# Patient Record
Sex: Male | Born: 1965 | Race: White | Hispanic: No | Marital: Married | Smoking: Never smoker
Health system: Southern US, Community
[De-identification: ages and names within clinical notes are randomized; demographics above are authoritative.]

## PROBLEM LIST (undated history)

## (undated) DIAGNOSIS — I10 Essential (primary) hypertension: Secondary | ICD-10-CM

## (undated) DIAGNOSIS — D649 Anemia, unspecified: Secondary | ICD-10-CM

## (undated) DIAGNOSIS — K900 Celiac disease: Secondary | ICD-10-CM

## (undated) DIAGNOSIS — Z8489 Family history of other specified conditions: Secondary | ICD-10-CM

## (undated) DIAGNOSIS — R7303 Prediabetes: Secondary | ICD-10-CM

## (undated) DIAGNOSIS — G4730 Sleep apnea, unspecified: Secondary | ICD-10-CM

## (undated) DIAGNOSIS — J189 Pneumonia, unspecified organism: Secondary | ICD-10-CM

## (undated) HISTORY — DX: Celiac disease: K90.0

## (undated) HISTORY — DX: Sleep apnea, unspecified: G47.30

## (undated) HISTORY — PX: TONSILLECTOMY: SHX5217

---

## 2009-07-09 ENCOUNTER — Inpatient Hospital Stay (HOSPITAL_COMMUNITY): Admission: EM | Admit: 2009-07-09 | Discharge: 2009-07-11 | Payer: Self-pay | Admitting: Emergency Medicine

## 2009-07-09 ENCOUNTER — Ambulatory Visit: Payer: Self-pay | Admitting: Family Medicine

## 2009-07-09 ENCOUNTER — Telehealth: Payer: Self-pay | Admitting: Family Medicine

## 2009-07-09 DIAGNOSIS — R0609 Other forms of dyspnea: Secondary | ICD-10-CM | POA: Insufficient documentation

## 2009-07-09 DIAGNOSIS — R5383 Other fatigue: Secondary | ICD-10-CM

## 2009-07-09 DIAGNOSIS — J309 Allergic rhinitis, unspecified: Secondary | ICD-10-CM | POA: Insufficient documentation

## 2009-07-09 DIAGNOSIS — J45909 Unspecified asthma, uncomplicated: Secondary | ICD-10-CM | POA: Insufficient documentation

## 2009-07-09 DIAGNOSIS — R5381 Other malaise: Secondary | ICD-10-CM | POA: Insufficient documentation

## 2009-07-09 DIAGNOSIS — R0989 Other specified symptoms and signs involving the circulatory and respiratory systems: Secondary | ICD-10-CM

## 2009-07-09 LAB — CONVERTED CEMR LAB
Albumin: 4 g/dL (ref 3.5–5.2)
HCT: 14.2 % — CL (ref 39.0–52.0)
HDL: 28 mg/dL — ABNORMAL LOW (ref >39.00)
Hemoglobin: 4 g/dL — CL (ref 13.0–17.0)
Iron: 6 ug/dL — ABNORMAL LOW (ref 42–165)
LDL Cholesterol: 86 mg/dL (ref 0–99)
RBC: 2.38 M/uL — ABNORMAL LOW (ref 4.22–5.81)
Saturation Ratios: 1 % — ABNORMAL LOW (ref 20.0–50.0)
Total CHOL/HDL Ratio: 5
Transferrin: 444.2 mg/dL — ABNORMAL HIGH (ref 212.0–360.0)
Triglycerides: 84 mg/dL (ref 0.0–149.0)
WBC: 9.3 10*3/uL (ref 4.5–10.5)

## 2009-07-11 ENCOUNTER — Encounter (INDEPENDENT_AMBULATORY_CARE_PROVIDER_SITE_OTHER): Payer: Self-pay | Admitting: Gastroenterology

## 2009-07-21 ENCOUNTER — Ambulatory Visit: Payer: Self-pay | Admitting: Family Medicine

## 2009-07-21 ENCOUNTER — Encounter: Payer: Self-pay | Admitting: Family Medicine

## 2009-07-21 DIAGNOSIS — D509 Iron deficiency anemia, unspecified: Secondary | ICD-10-CM | POA: Insufficient documentation

## 2009-07-21 LAB — CONVERTED CEMR LAB
Basophils Absolute: 0 10*3/uL (ref 0.0–0.1)
HCT: 32.7 % — ABNORMAL LOW (ref 39.0–52.0)
Lymphs Abs: 1 10*3/uL (ref 0.7–4.0)
Monocytes Relative: 8.2 % (ref 3.0–12.0)
Platelets: 451 10*3/uL — ABNORMAL HIGH (ref 150.0–400.0)
RDW: 28.6 % — ABNORMAL HIGH (ref 11.5–14.6)
Retic Ct Pct: 1 % (ref 0.4–3.1)

## 2009-08-05 ENCOUNTER — Telehealth: Payer: Self-pay | Admitting: Family Medicine

## 2009-09-10 ENCOUNTER — Encounter: Payer: Self-pay | Admitting: Family Medicine

## 2009-09-10 ENCOUNTER — Ambulatory Visit: Payer: Self-pay | Admitting: Family Medicine

## 2009-09-10 DIAGNOSIS — K900 Celiac disease: Secondary | ICD-10-CM | POA: Insufficient documentation

## 2009-09-14 LAB — CONVERTED CEMR LAB
Basophils Absolute: 0.1 10*3/uL (ref 0.0–0.1)
Eosinophils Absolute: 0.3 10*3/uL (ref 0.0–0.7)
HCT: 39.2 % (ref 39.0–52.0)
Hemoglobin: 12.9 g/dL — ABNORMAL LOW (ref 13.0–17.0)
Lymphocytes Relative: 17.2 % (ref 12.0–46.0)
Lymphs Abs: 0.9 10*3/uL (ref 0.7–4.0)
MCHC: 33 g/dL (ref 30.0–36.0)
Monocytes Absolute: 0.5 10*3/uL (ref 0.1–1.0)
Neutro Abs: 3.6 10*3/uL (ref 1.4–7.7)
RDW: 13 % (ref 11.5–14.6)

## 2009-09-28 ENCOUNTER — Encounter: Admission: RE | Admit: 2009-09-28 | Discharge: 2009-09-28 | Payer: Self-pay | Admitting: Gastroenterology

## 2009-12-14 ENCOUNTER — Ambulatory Visit: Payer: Self-pay | Admitting: Family Medicine

## 2009-12-14 LAB — CONVERTED CEMR LAB: Iron: 85 ug/dL (ref 42–165)

## 2009-12-15 LAB — CONVERTED CEMR LAB
Basophils Absolute: 0 10*3/uL (ref 0.0–0.1)
HCT: 44.4 % (ref 39.0–52.0)
Lymphs Abs: 1.2 10*3/uL (ref 0.7–4.0)
MCHC: 33.9 g/dL (ref 30.0–36.0)
MCV: 89.1 fL (ref 78.0–100.0)
Monocytes Absolute: 0.7 10*3/uL (ref 0.1–1.0)
Platelets: 272 10*3/uL (ref 150.0–400.0)
RDW: 12.8 % (ref 11.5–14.6)

## 2010-01-17 ENCOUNTER — Ambulatory Visit: Payer: Self-pay | Admitting: Family Medicine

## 2010-01-17 DIAGNOSIS — H1030 Unspecified acute conjunctivitis, unspecified eye: Secondary | ICD-10-CM | POA: Insufficient documentation

## 2010-01-17 DIAGNOSIS — J0110 Acute frontal sinusitis, unspecified: Secondary | ICD-10-CM | POA: Insufficient documentation

## 2010-03-29 ENCOUNTER — Telehealth: Payer: Self-pay | Admitting: Family Medicine

## 2010-03-31 ENCOUNTER — Ambulatory Visit: Payer: Self-pay | Admitting: Family Medicine

## 2010-04-01 LAB — CONVERTED CEMR LAB
Basophils Relative: 0.3 % (ref 0.0–3.0)
Eosinophils Absolute: 0.2 10*3/uL (ref 0.0–0.7)
HCT: 45.9 % (ref 39.0–52.0)
Lymphs Abs: 1.3 10*3/uL (ref 0.7–4.0)
MCHC: 33.6 g/dL (ref 30.0–36.0)
MCV: 91.7 fL (ref 78.0–100.0)
Monocytes Absolute: 0.6 10*3/uL (ref 0.1–1.0)
Neutrophils Relative %: 73.7 % (ref 43.0–77.0)
Platelets: 300 10*3/uL (ref 150.0–400.0)

## 2010-06-19 IMAGING — CR DG CHEST 2V
3 series · 3 of 3 positions shown · non-contrast
Comparison: None

CLINICAL DATA: Shortness of breath

CHEST - 2 VIEW

[w chest pa]
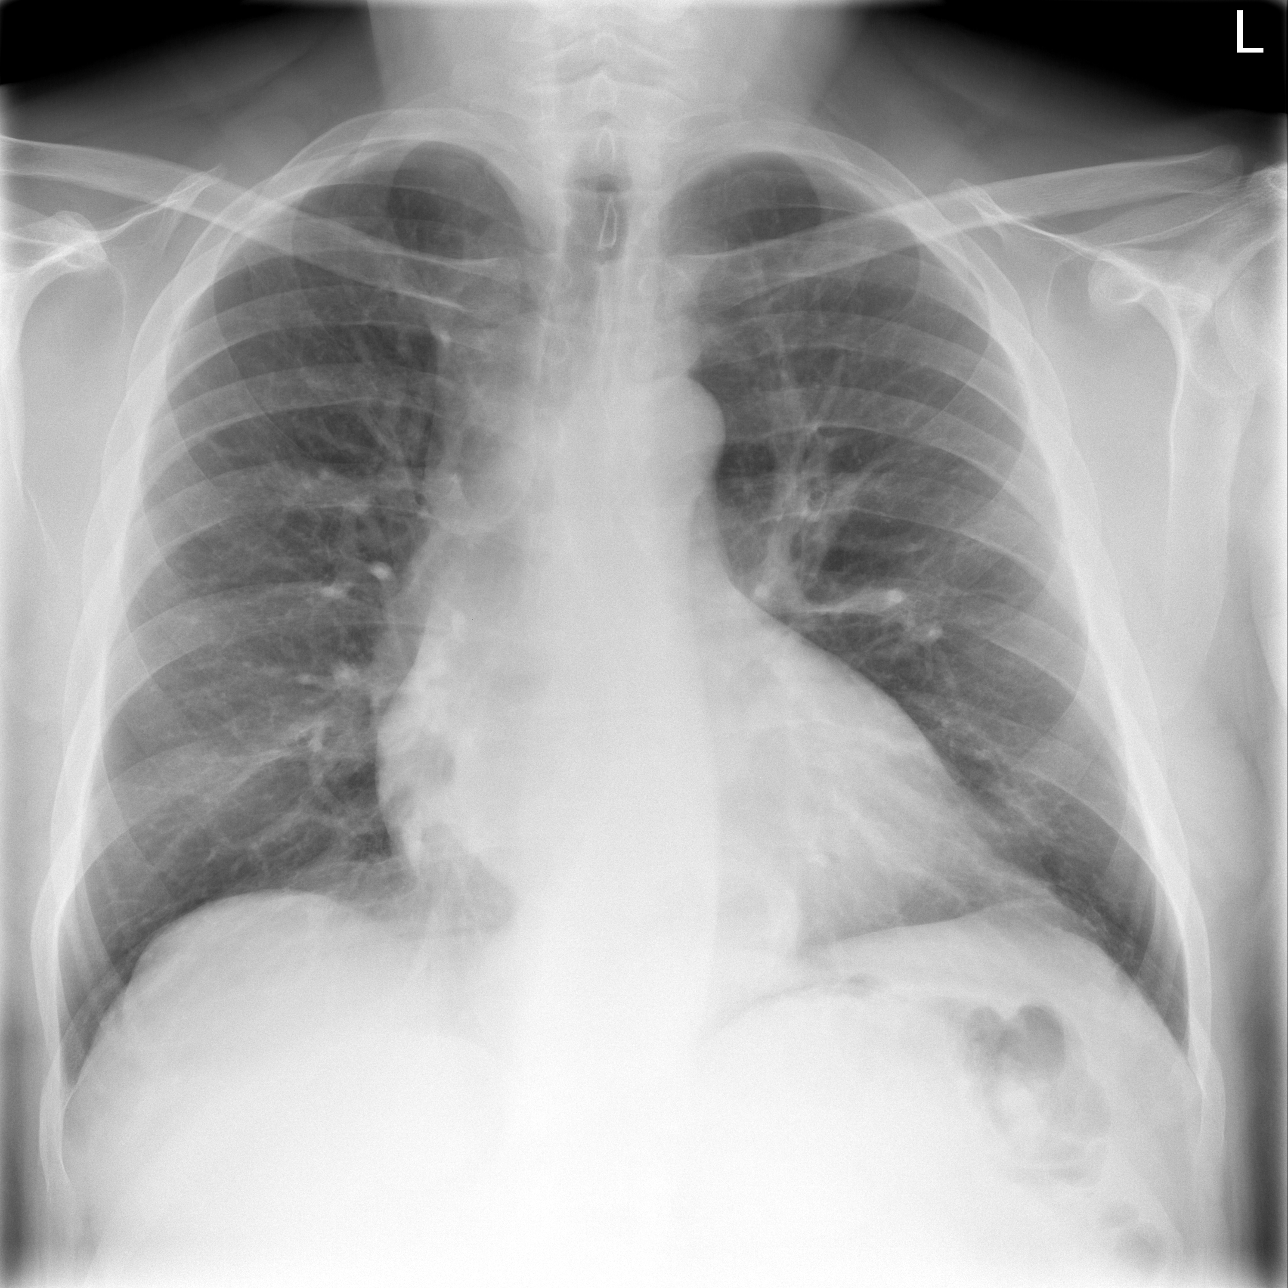

[w chest lat (1 of 2)]
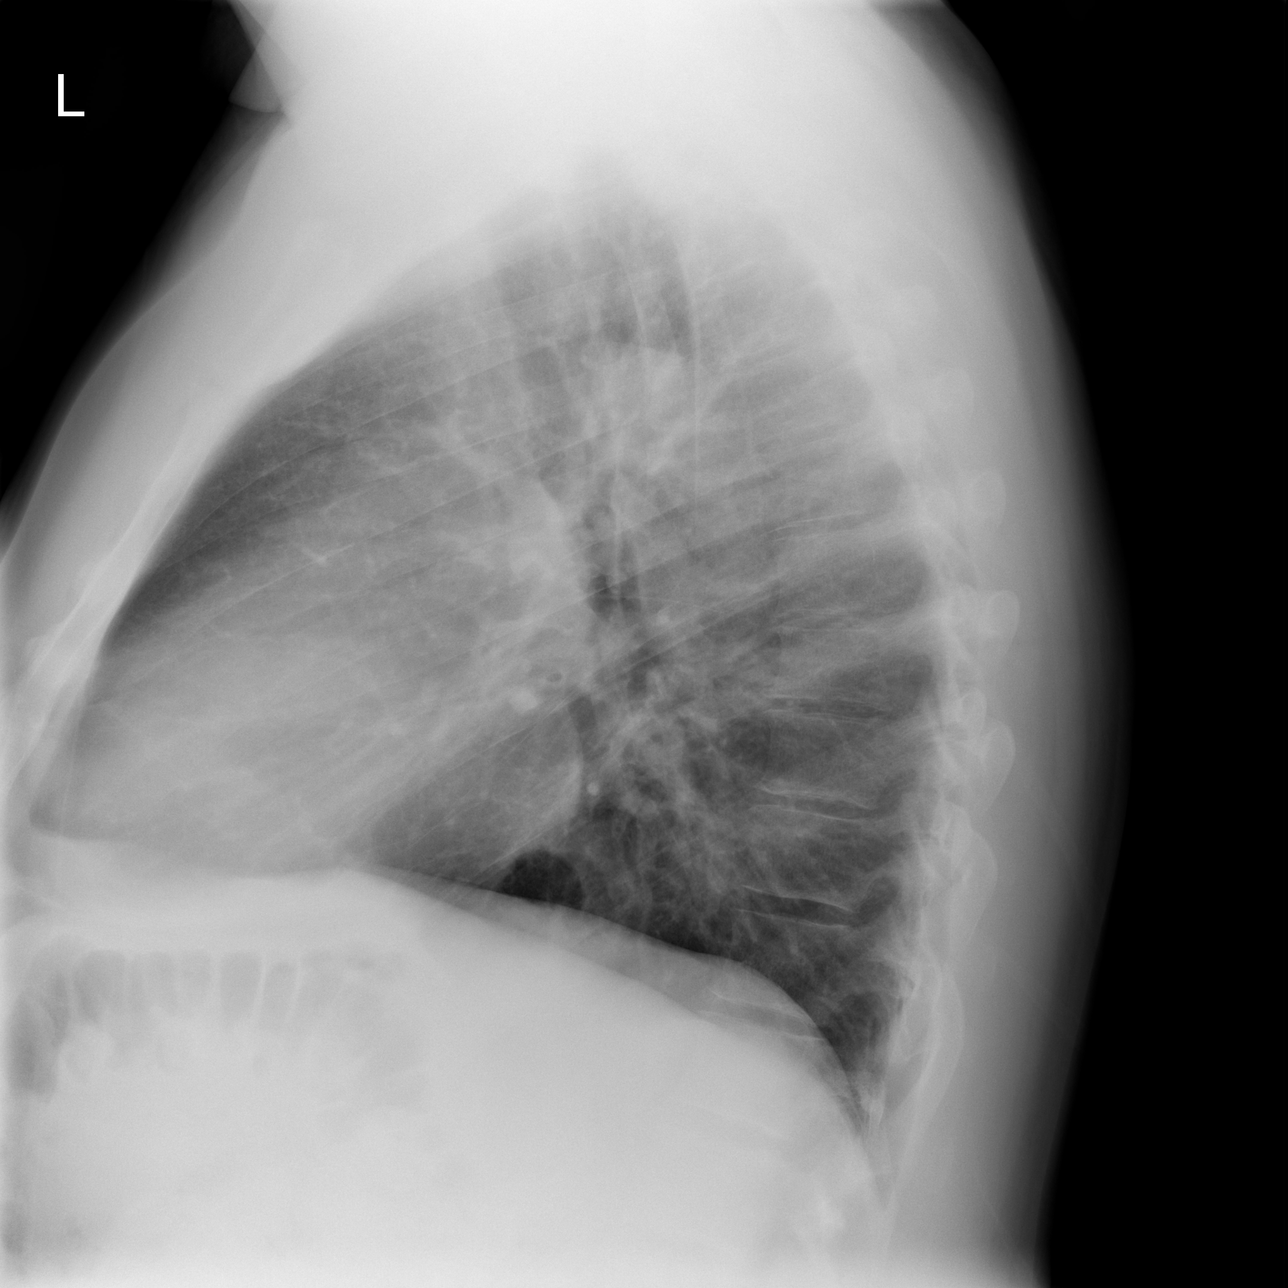

[w chest lat (2 of 2)]
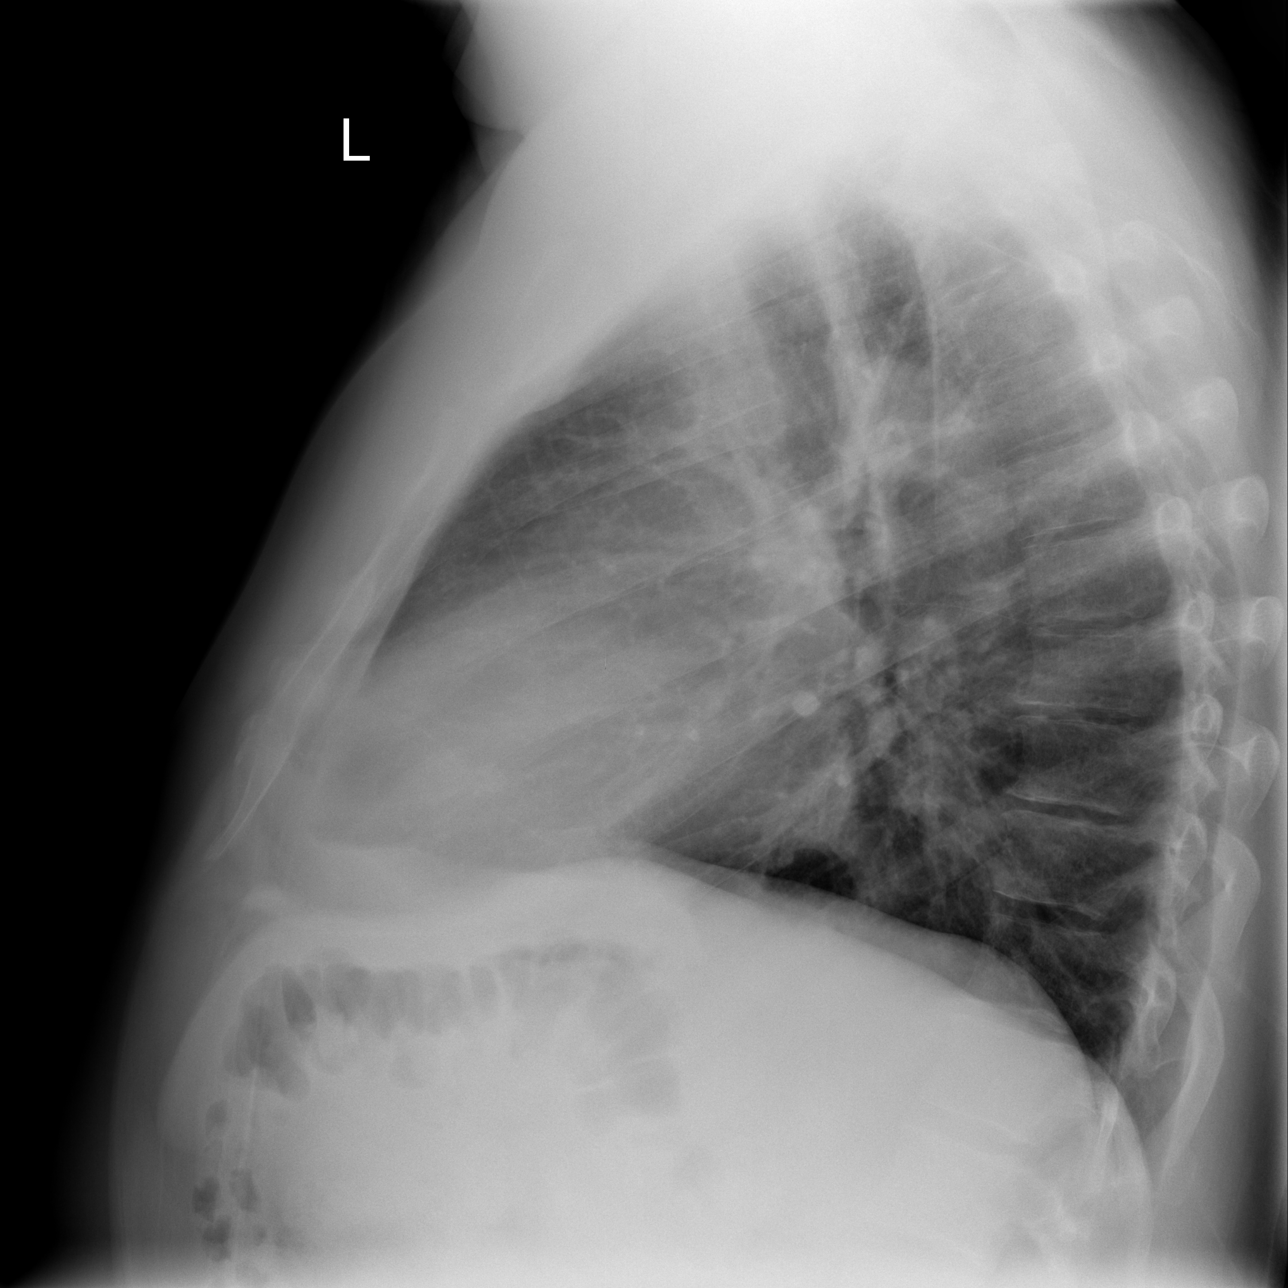

[3 of 3 positions shown; findings below may reference images not displayed]

FINDINGS: Heart size is mildly prominent, with suggestion of left
atrial hypertrophy.  Pulmonary vascularity is normal.  Minimal
linear left basilar atelectasis.  No airspace disease, effusion, or
pneumothorax.  Small to moderate hiatal hernia is noted.  No acute
osseous abnormality.
IMPRESSION: 1. Mild cardiomegaly with probable left atrial enlargement.
2.  Minimal left basilar atelectasis.
3.  Small to moderate hiatal hernia.

## 2010-09-27 NOTE — Progress Notes (Signed)
Summary: regarding hgb  Phone Note Call from Patient Call back at Home Phone [phone]   Caller: Patient Call For: Arnette Norris MD Summary of Call: Pt asks if he needs to continue to get his hgb checked, since he had to have a transfusion last fall,  last check in april was 15.1. Initial call taken by: Marty Heck CMA,  March 29, 2010 11:58 AM  Follow-up for Phone Call        WE can recheck it again at his convenience and if it is ok then, we can stop routinely checking it. Arnette Norris MD  March 29, 2010 12:08 PM  Advised pt, lab appt made.            Marty Heck CMA  March 29, 2010 2:42 PM

## 2010-09-27 NOTE — Assessment & Plan Note (Signed)
Summary: pink eye/alc   Vital Signs:  Patient profile:   45 year old male Height:      74 inches Weight:      298.50 pounds BMI:     38.46 Temp:     98.6 degrees F oral Pulse rate:   76 / minute Pulse rhythm:   regular BP sitting:   140 / 120  (left arm) Cuff size:   large  Vitals Entered By: Sherrian Divers CMA Deborra Medina) (Jan 17, 2010 12:09 PM) CC: ? Pink eye   History of Present Illness: 45 yo here for chest cold and ?pink eye.  Over one week ago, started with runny nose, productive cough. Felt like he was getting better until two days ago, head congestion worsened, now has facial pressure. Woke up yesterday, both eyes matted shut with thich green discharge. Today matted shut again and eyes are very irritated and red. No photophobia, blurred vision. Subjective fevers, no chills. No n/v/d.  Current Medications (verified): 1)  Zyrtec Allergy 10 Mg Tabs (Cetirizine Hcl) .... Take 1 Tablet By Mouth Once A Day As Needed 2)  Ferrous Sulfate 325 (65 Fe) Mg Tabs (Ferrous Sulfate) .... Take 1 Tablet By Mouth Two Times A Day 3)  Ventolin Hfa 108 (90 Base) Mcg/act Aers (Albuterol Sulfate) .Marland Kitchen.. 1-2 Puff Every 4 Hours As Needed. 4)  Nexium 40 Mg Cpdr (Esomeprazole Magnesium) .... Take One Tablet By Mouth Daily 5)  Polytrim 10000-0.1 Unit/ml-%  Soln (Polymyxin B-Trimethoprim) .Marland Kitchen.. 1 Gtt in Affected Eye Gid X 5days 6)  Azithromycin 250 Mg  Tabs (Azithromycin) .... 2 By  Mouth Today and Then 1 Daily For 4 Days  Allergies (verified): No Known Drug Allergies  Review of Systems      See HPI General:  Complains of fever; denies chills. Eyes:  Complains of discharge and eye irritation; denies blurring, double vision, eye pain, halos, itching, light sensitivity, vision loss-1 eye, and vision loss-both eyes. ENT:  Complains of nasal congestion, sinus pressure, and sore throat. CV:  Denies chest pain or discomfort. Resp:  Complains of cough and sputum productive; denies shortness of breath and  wheezing.  Physical Exam  General:  alert, obese, congested Eyes:  pupils equal, pupils round, and conjunctival injection bilaterally, thick yellow discharge crusted on eyelashes.   Ears:  TMs retracted bilaterally. Nose:  mucosal erythema and mucosal edema.   frontal sinuses TTP Mouth:  Oral mucosa and oropharynx without lesions or exudates.  Teeth in good repair. Lungs:  Normal respiratory effort, chest expands symmetrically. Lungs are clear to auscultation, no crackles or wheezes. Heart:  Normal rate and regular rhythm. S1 and S2 normal without gallop, murmur, click, rub or other extra sounds. Extremities:  No clubbing, cyanosis, edema, or deformity noted with normal full range of motion of all joints.   Psych:  normally interactive, good eye contact, not anxious appearing, and not depressed appearing.     Impression & Recommendations:  Problem # 1:  CONJUNCTIVITIS, BACTERIAL, ACUTE (ICD-372.00) Assessment New Polytrim drops, advised good hand hygeine. His updated medication list for this problem includes:    Polytrim 10000-0.1 Unit/ml-% Soln (Polymyxin b-trimethoprim) .Marland Kitchen... 1 gtt in affected eye gid x 5days  Problem # 2:  ACUTE FRONTAL SINUSITIS (ICD-461.1) Assessment: New Given duration and progression of symptoms, likely bacterial. See pt instructions for details. His updated medication list for this problem includes:    Azithromycin 250 Mg Tabs (Azithromycin) .Marland Kitchen... 2 by  mouth today and then 1 daily for 4 days  Complete Medication List: 1)  Zyrtec Allergy 10 Mg Tabs (Cetirizine hcl) .... Take 1 tablet by mouth once a day as needed 2)  Ferrous Sulfate 325 (65 Fe) Mg Tabs (Ferrous sulfate) .... Take 1 tablet by mouth two times a day 3)  Ventolin Hfa 108 (90 Base) Mcg/act Aers (Albuterol sulfate) .Marland Kitchen.. 1-2 puff every 4 hours as needed. 4)  Nexium 40 Mg Cpdr (Esomeprazole magnesium) .... Take one tablet by mouth daily 5)  Polytrim 10000-0.1 Unit/ml-% Soln (Polymyxin  b-trimethoprim) .Marland Kitchen.. 1 gtt in affected eye gid x 5days 6)  Azithromycin 250 Mg Tabs (Azithromycin) .... 2 by  mouth today and then 1 daily for 4 days  Patient Instructions: 1)  Take Zpack as directed if no improvement in next few days.  Drink lots of fluids.  Treat sympotmatically with Mucinex, nasal saline irrigation, and Tylenol/Ibuprofen. . You can use warm compresses.   Call if not improving as expected in 5-7 days.  Prescriptions: AZITHROMYCIN 250 MG  TABS (AZITHROMYCIN) 2 by  mouth today and then 1 daily for 4 days  #6 x 0   Entered and Authorized by:   Arnette Norris MD   Signed by:   Arnette Norris MD on 01/17/2010   Method used:   Electronically to        [facility] (retail)       [place], [place]  [address]       Ph: [phone]       Fax: [phone]   RxID:   [id number] POLYTRIM 10000-0.1 UNIT/ML-%  SOLN (POLYMYXIN B-TRIMETHOPRIM) 1 gtt in affected eye gid x 5days  #1 x 0   Entered and Authorized by:   Arnette Norris MD   Signed by:   Arnette Norris MD on 01/17/2010   Method used:   Electronically to        [facility] (retail)       [place], [place]  [address]       Ph: [phone]       Fax: [phone]   RxID:   [id number]   Current Allergies (reviewed today): No known allergies

## 2010-09-27 NOTE — Assessment & Plan Note (Signed)
Summary: 1 month follow up [place]   Vital Signs:  Patient profile:   45 year old male Height:      74 inches Weight:      265.13 pounds BMI:     34.16 Temp:     98.4 degrees F oral Pulse rate:   80 / minute Pulse rhythm:   regular BP sitting:   130 / 86  (left arm) Cuff size:   large  Vitals Entered By: Christena Deem CMA (AAMA) (September 10, 2009 8:16 AM) CC: 1 month follow up   History of Present Illness: 44 yo here for follow up with gluten defiiency/ severe iron deficiency anemia.  Admitted to [facility] on 11/12 with Hbg 4, TIBC 448, Ferritin 1, Retic 0.8, Iron 11, Vit B12 280. Discharged on 11/14.   No signs of active bleeding.  EGD results consistent with Celiac.  Gliadin IgG 18. Transfused 6 units in hospital, Hbg at DC was 8.5.  has been taking Ferrous Sulfate 325 mg two times a day and Protonix 40 mg daily.  He feels like a new man.  no longer fatigued or short of breath.  Started raking his leaves again. Continues taking his iron, no issues with constipation.  He would like to continue taking his protonix because it is helping greatly with his GERD symptoms.  No f/u scheduled with Dr. Hoyt Koch) but he does have appt with nutritionist on February 1st.  Current Medications (verified): 1)  Zyrtec Allergy 10 Mg Tabs (Cetirizine Hcl) .... Take 1 Tablet By Mouth Once A Day As Needed 2)  Ferrous Sulfate 325 (65 Fe) Mg Tabs (Ferrous Sulfate) .... Take 1 Tablet By Mouth Two Times A Day 3)  Pantoprazole Sodium 40 Mg Tbec (Pantoprazole Sodium) .... Take 1 Tablet By Mouth Once A Day 4)  Ventolin Hfa 108 (90 Base) Mcg/act Aers (Albuterol Sulfate) .Marland Kitchen.. 1-2 Puff Every 4 Hours As Needed.  Allergies (verified): No Known Drug Allergies  Review of Systems      See HPI General:  Denies fatigue and malaise. CV:  Denies chest pain or discomfort. Resp:  Denies shortness of breath.  Physical Exam  General:  alert, obese, NORMAL skin tone Mouth:  Oral mucosa and oropharynx without lesions or  exudates.  Teeth in good repair. Lungs:  Normal respiratory effort, chest expands symmetrically. Lungs are clear to auscultation, no crackles or wheezes. Heart:  Normal rate and regular rhythm. S1 and S2 normal without gallop, murmur, click, rub or other extra sounds. Abdomen:  Bowel sounds positive,abdomen soft and non-tender without masses, organomegaly or hernias noted. Psych:  normally interactive, good eye contact, not anxious appearing, and not depressed appearing.     Impression & Recommendations:  Problem # 1:  ANEMIA, IRON DEFICIENCY (ICD-280.9) Assessment Unchanged Due to gluten/celiac.  Will recheck iron studies today.  Follow up in 2-3 months. His updated medication list for this problem includes:    Ferrous Sulfate 325 (65 Fe) Mg Tabs (Ferrous sulfate) .Marland Kitchen... Take 1 tablet by mouth two times a day  Orders: Venipuncture (25956) TLB-CBC Platelet - w/Differential (85025-CBCD) TLB-IBC Pnl (Iron/FE;Transferrin) (83550-IBC) T-Reticulocyte Count, Automated (38756-43329)  Complete Medication List: 1)  Zyrtec Allergy 10 Mg Tabs (Cetirizine hcl) .... Take 1 tablet by mouth once a day as needed 2)  Ferrous Sulfate 325 (65 Fe) Mg Tabs (Ferrous sulfate) .... Take 1 tablet by mouth two times a day 3)  Pantoprazole Sodium 40 Mg Tbec (Pantoprazole sodium) .... Take 1 tablet by mouth once a day 4)  Ventolin Hfa 108 (90 Base) Mcg/act Aers (Albuterol sulfate) .Marland Kitchen.. 1-2 puff every 4 hours as needed.  Patient Instructions: 1)  Good to see you, Thayer Jew. 2)  Please come back to see me in 2-3 months. 3)  Have a great weekend. Prescriptions: VENTOLIN HFA 108 (90 BASE) MCG/ACT AERS (ALBUTEROL SULFATE) 1-2 puff every 4 hours as needed.  #1 x 0   Entered and Authorized by:   Arnette Norris MD   Signed by:   Arnette Norris MD on 09/10/2009   Method used:   Electronically to        [facility] (retail)       [place], [place]  [address]        Ph: [phone]       Fax: [phone]   RxID:   [id number]   Current Allergies (reviewed today): No known allergies   Appended Document: Orders Update    Clinical Lists Changes  Orders: Added new Service order of Specimen Handling (99000) - Signed

## 2010-10-17 ENCOUNTER — Ambulatory Visit (INDEPENDENT_AMBULATORY_CARE_PROVIDER_SITE_OTHER): Payer: BC Managed Care – PPO | Admitting: Family Medicine

## 2010-10-17 ENCOUNTER — Encounter: Payer: Self-pay | Admitting: Family Medicine

## 2010-10-17 ENCOUNTER — Other Ambulatory Visit: Payer: Self-pay | Admitting: Family Medicine

## 2010-10-17 ENCOUNTER — Ambulatory Visit: Payer: Self-pay | Admitting: Family Medicine

## 2010-10-17 DIAGNOSIS — K900 Celiac disease: Secondary | ICD-10-CM

## 2010-10-17 DIAGNOSIS — G4710 Hypersomnia, unspecified: Secondary | ICD-10-CM

## 2010-10-17 DIAGNOSIS — G4733 Obstructive sleep apnea (adult) (pediatric): Secondary | ICD-10-CM | POA: Insufficient documentation

## 2010-10-17 DIAGNOSIS — G4730 Sleep apnea, unspecified: Secondary | ICD-10-CM

## 2010-10-17 DIAGNOSIS — R5381 Other malaise: Secondary | ICD-10-CM

## 2010-10-17 LAB — CBC WITH DIFFERENTIAL/PLATELET
Basophils Absolute: 0 10*3/uL (ref 0.0–0.1)
Basophils Relative: 0.3 % (ref 0.0–3.0)
Eosinophils Absolute: 0.4 10*3/uL (ref 0.0–0.7)
HCT: 36.8 % — ABNORMAL LOW (ref 39.0–52.0)
Hemoglobin: 11.6 g/dL — ABNORMAL LOW (ref 13.0–17.0)
Lymphocytes Relative: 10.8 % — ABNORMAL LOW (ref 12.0–46.0)
Lymphs Abs: 0.9 10*3/uL (ref 0.7–4.0)
MCHC: 31.6 g/dL (ref 30.0–36.0)
MCV: 88.1 fl (ref 78.0–100.0)
Monocytes Absolute: 0.6 10*3/uL (ref 0.1–1.0)
Neutro Abs: 6.1 10*3/uL (ref 1.4–7.7)
RBC: 4.18 Mil/uL — ABNORMAL LOW (ref 4.22–5.81)
RDW: 14.1 % (ref 11.5–14.6)

## 2010-10-17 LAB — B12 AND FOLATE PANEL: Folate: 5.2 ng/mL — ABNORMAL LOW (ref >5.9)

## 2010-10-17 LAB — BASIC METABOLIC PANEL
CO2: 30 mEq/L (ref 19–32)
Calcium: 9.3 mg/dL (ref 8.4–10.5)
Chloride: 102 mEq/L (ref 96–112)
Glucose, Bld: 86 mg/dL (ref 70–99)
Sodium: 140 mEq/L (ref 135–145)

## 2010-10-18 ENCOUNTER — Ambulatory Visit: Payer: Self-pay | Admitting: Family Medicine

## 2010-10-25 NOTE — Assessment & Plan Note (Signed)
Summary: ANEMIA/ SLEEPY ALL THE TIME/CLE   Vital Signs:  Patient profile:   45 year old male Weight:      311 pounds BMI:     40.07 Temp:     98.5 degrees F oral Pulse rate:   72 / minute Pulse rhythm:   regular BP sitting:   142 / 98  (left arm) Cuff size:   large  Vitals Entered By: Edwin Dada CMA Deborra Medina) (October 17, 2010 8:28 AM) CC: fatigue   History of Present Illness: 45 yo with h/o gluten defiiency/ severe iron deficiency anemia with 6 months of worsening fatigue.  Admitted to [facility] on 06/2009 with Hbg 4, TIBC 448, Ferritin 1, Retic 0.8, Iron 11, Vit B12 280.  EGD results consistent with Celiac.  Gliadin IgG 18. Has been taking Ferrous sulfate 325 mg two times a day since.  Have been following yearly CBC, most recently  within normal lmiits in 03/2010.  For past six months, he has noticed increased fatigue. No SOB or DOE like he had when first diagnosed but can fall asleep very easily- while at desk, sometimes even when driving.  He does have a long commute and thought nothing of his sleepiness but he feels like it is getting progressively worse. Wife does hear him cough/choke at times in the middle of the night. He feels like he has been getting plenty of sleep.  Has always had night time awakenings for urination, that has not increased.  No CP, orthostatis, blood in stool. Admits to being very compliant with gluten free diet on 3 out of 7 days per week.  Has gained 13 pounds since last office visit.  Current Medications (verified): 1)  Zyrtec Allergy 10 Mg Tabs (Cetirizine Hcl) .... Take 1 Tablet By Mouth Once A Day As Needed 2)  Ferrous Sulfate 325 (65 Fe) Mg Tabs (Ferrous Sulfate) .... Take 1 Tablet By Mouth Two Times A Day 3)  Ventolin Hfa 108 (90 Base) Mcg/act Aers (Albuterol Sulfate) .Marland Kitchen.. 1-2 Puff Every 4 Hours As Needed. 4)  Nexium 40 Mg Cpdr (Esomeprazole Magnesium) .... Take One Tablet By Mouth Daily  Allergies (verified): No Known Drug  Allergies  Past History:  Past Medical History: Last updated: 07/09/2009 Allergic rhinitis Asthma  Past Surgical History: Last updated: 07/09/2009 Tonsillectomy  Family History: Last updated: 07/09/2009 Mom - HTN Dad- HTn, HLD Maternal Grandfather- leukemia Paternal grandfather- ?CVA in 7s  Social History: Last updated: 07/09/2009 LIves with wife and two daughters, 54 and 15 in [place].  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 Never Smoked Alcohol use-no Drug use-no Regular exercise-no  Risk Factors: Exercise: no (07/09/2009)  Risk Factors: Smoking Status: never (07/09/2009)  Review of Systems      See HPI General:  Complains of fatigue; denies fever and loss of appetite. CV:  Denies chest pain or discomfort. Resp:  Denies shortness of breath. GI:  Denies abdominal pain, bloody stools, and change in bowel habits.  Physical Exam  General:  alert, obese, does not appear pale Head:  normocephalic and atraumatic.   Eyes:  vision grossly intact, pupils equal, pupils round, and pupils reactive to light.   Ears:  R ear normal and L ear normal.   Mouth:  good dentition.   Lungs:  Normal respiratory effort, chest expands symmetrically. Lungs are clear to auscultation, no crackles or wheezes. Heart:  Normal rate and regular rhythm. S1 and S2 normal without gallop, murmur, click, rub or other extra sounds.  Extremities:  No clubbing, cyanosis, edema, or deformity noted with normal full range of motion of all joints.   Psych:  normally interactive, good eye contact, not anxious appearing, and not depressed appearing.     Impression & Recommendations:  Problem # 1:  FATIGUE (ICD-780.79) Assessment Deteriorated likely multifactorial. ? worsening anemia with possible malabsorption due to celiac- will check CBC, Vit D, B12/folate, TIBC, TSH and a1c. Also appears to be a component of sleep apnea, less likely narcolepsy.  Will refer for sleep  study.  Problem # 2:  HYPERSOMNIA, ASSOCIATED WITH SLEEP APNEA (ICD-780.53) Assessment: New see above. Orders: Sleep Study (Sleep Study)  Complete Medication List: 1)  Zyrtec Allergy 10 Mg Tabs (Cetirizine hcl) .... Take 1 tablet by mouth once a day as needed 2)  Ferrous Sulfate 325 (65 Fe) Mg Tabs (Ferrous sulfate) .... Take 1 tablet by mouth two times a day 3)  Ventolin Hfa 108 (90 Base) Mcg/act Aers (Albuterol sulfate) .Marland Kitchen.. 1-2 puff every 4 hours as needed. 4)  Nexium 40 Mg Cpdr (Esomeprazole magnesium) .... Take one tablet by mouth daily  Other Orders: Venipuncture (03704) TLB-B12 + Folate Pnl (88891_69450-T88/EKC) TLB-IBC Pnl (Iron/FE;Transferrin) (83550-IBC) TLB-BMP (Basic Metabolic Panel-BMET) (00349-ZPHXTAV) TLB-CBC Platelet - w/Differential (85025-CBCD) TLB-TSH (Thyroid Stimulating Hormone) (84443-TSH) Specimen Handling (99000) TLB-A1C / Hgb A1C (Glycohemoglobin) (83036-A1C) T-Vitamin D (25-Hydroxy) (69794-80165)  Patient Instructions: 1)  Great to see you. 2)  Please stop by to see Rosaria Ferries on your way out.   Orders Added: 1)  Sleep Study [Sleep Study] 2)  Venipuncture [53748] 3)  TLB-B12 + Folate Pnl [82746_82607-B12/FOL] 4)  TLB-IBC Pnl (Iron/FE;Transferrin) [83550-IBC] 5)  TLB-BMP (Basic Metabolic Panel-BMET) [27078-MLJQGBE] 6)  TLB-CBC Platelet - w/Differential [85025-CBCD] 7)  TLB-TSH (Thyroid Stimulating Hormone) [84443-TSH] 8)  Specimen Handling [99000] 9)  TLB-A1C / Hgb A1C (Glycohemoglobin) [83036-A1C] 10)  T-Vitamin D (25-Hydroxy) [01007-12197] 11)  Est. Patient Level IV [58832]     Orders Added: 1)  Sleep Study [Sleep Study] 2)  Venipuncture [54982] 3)  TLB-B12 + Folate Pnl [82746_82607-B12/FOL] 4)  TLB-IBC Pnl (Iron/FE;Transferrin) [83550-IBC] 5)  TLB-BMP (Basic Metabolic Panel-BMET) [64158-XENMMHW] 6)  TLB-CBC Platelet - w/Differential [85025-CBCD] 7)  TLB-TSH (Thyroid Stimulating Hormone) [84443-TSH] 8)  Specimen Handling [99000] 9)   TLB-A1C / Hgb A1C (Glycohemoglobin) [83036-A1C] 10)  T-Vitamin D (25-Hydroxy) [80881-10315] 11)  Est. Patient Level IV [94585]   Current Allergies (reviewed today): No known allergies

## 2010-10-28 ENCOUNTER — Encounter: Payer: Self-pay | Admitting: Pulmonary Disease

## 2010-10-28 ENCOUNTER — Institutional Professional Consult (permissible substitution) (INDEPENDENT_AMBULATORY_CARE_PROVIDER_SITE_OTHER): Payer: BC Managed Care – PPO | Admitting: Pulmonary Disease

## 2010-10-28 DIAGNOSIS — G4730 Sleep apnea, unspecified: Secondary | ICD-10-CM

## 2010-10-28 DIAGNOSIS — G4710 Hypersomnia, unspecified: Secondary | ICD-10-CM

## 2010-11-03 NOTE — Assessment & Plan Note (Addendum)
Summary: sleep consult/dr aron/mhh   Visit Type:  Initial Consult Copy to:  pcp Primary Provider/Referring Provider:  Arnette Norris MD  CC:  Pt here for sleep consult.  History of Present Illness: 65?M , obese for evlaution of obstructive sleep apnea  c/o excessive daytime somnolence  Epworth Sleepiness Score 16/2 drifts off to sleep , even in am going to work Bedtime 11pm, latency minimal, sleeps on his side, moves around a lot, mouth open, dry mouth, wife noted gasping for air, Nocturia, oob 0700 - tired, no headaches, travel mug coffee am, weekends - stays later in bed - still tired Blood work  - vit D & B12 supplementation - helping some Reflux under control About 40 lbs wt gain last 2 yrs, lowest 190 , gained after diagnosis of celiac sprue There is no history suggestive of cataplexy, sleep paralysis or parasomnias   Preventive Screening-Counseling & Management  Alcohol-Tobacco     Alcohol drinks/day: occ     Smoking Status: never   History of Present Illness: Falling asleep during day at work when not busy or actively [place] something. Drift off some when driving in PM now in AM. Gotten slowly better with vitamin D supplement. Wake up often in evening.  What time do you typically go to bed?(between what hours): 11pm-6am  How long does it take you to fall asleep? not long 5 minutes tops  How many times during the night do you wake up? depends every hour or 3 to 4 times  What time do you get out of bed to start your day? 6:30am-7:00am  Do you drive or operate heavy machinery in your occupation? no  How much has your weight changed (up or down) over the past two years? (in pounds): increase 40 lbs  Have you ever had a sleep study before?  If yes,when and where: no  Do you currently use CPAP ? If so , at what pressure? no  Do you wear oxygen at any time? If yes, how many liters per minute? no Current Medications (verified): 1)  Zyrtec Allergy 10 Mg Tabs (Cetirizine Hcl)  .... Take 1 Tablet By Mouth Once A Day As Needed 2)  Ferrous Sulfate 325 (65 Fe) Mg Tabs (Ferrous Sulfate) .... Take 1 Tablet By Mouth Two Times A Day 3)  Ventolin Hfa 108 (90 Base) Mcg/act Aers (Albuterol Sulfate) .Marland Kitchen.. 1-2 Puff Every 4 Hours As Needed. 4)  Zantac 150 Mg Tabs (Ranitidine Hcl) .... Take 3 Tablet By Mouth Once A Day 5)  Vitamin D3 50000 Unit Caps (Cholecalciferol) .... Take One Tablet By Mouth Once A Week For Six Weeks 6)  Multivitamins   Tabs (Multiple Vitamin) .... Take 1 Tablet By Mouth Once A Day  Allergies (verified): No Known Drug Allergies  Past History:  Past Surgical History: Last updated: 07/09/2009 Tonsillectomy  Past Medical History: Allergic rhinitis Asthma celiac sprue  Social History: Alcohol drinks/day:  occ  Review of Systems       The patient complains of hand/feet swelling.  The patient denies shortness of breath with activity, shortness of breath at rest, productive cough, non-productive cough, coughing up blood, chest pain, irregular heartbeats, acid heartburn, indigestion, loss of appetite, weight change, abdominal pain, difficulty swallowing, sore throat, tooth/dental problems, headaches, nasal congestion/difficulty breathing through nose, sneezing, itching, ear ache, anxiety, depression, joint stiffness or pain, rash, change in color of mucus, and fever.    Vital Signs:  Patient profile:   45 year old male Height:  74 inches Weight:      312.8 pounds BMI:     40.31 O2 Sat:      98 % on Room air Temp:     98.0 degrees F oral Pulse rate:   85 / minute BP sitting:   130 / 90  (right arm) Cuff size:   large  Vitals Entered By: Iran Planas CMA (October 28, 2010 4:30 PM)  O2 Flow:  Room air CC: Pt here for sleep consult Comments Medications reviewed with patient Verified contact number and pharmacy with patient Iran Planas CMA  October 28, 2010 4:31 PM    Physical Exam  Additional Exam:  wt 313 October 29, 2010  Gen.  Pleasant, well-nourished, in no distress, normal affect ENT - no lesions, no post nasal drip, class 3 airway Neck: No JVD, no thyromegaly, no carotid bruits Lungs: no use of accessory muscles, no dullness to percussion, clear without rales or rhonchi  Cardiovascular: Rhythm regular, heart sounds  normal, no murmurs or gallops, no peripheral edema Abdomen: soft and non-tender, no hepatosplenomegaly, BS normal. Musculoskeletal: No deformities, no cyanosis or clubbing Neuro:  alert, non focal     Impression & Recommendations:  Problem # 1:  HYPERSOMNIA, ASSOCIATED WITH SLEEP APNEA (ICD-780.53) The pathophysiology of obstructive sleep apnea, it's cardiovascular consequences and modes of treatment including CPAP were discussed with the patient in great detail.  Given excessive daytime somnolence , loud snoring, gasping episodes, obstructive sleep apnea is very likely & an overnight pSG will be scheduled as  a split study Orders: Consultation Level III (09407) Sleep Study (Sleep Study)  Medications Added to Medication List This Visit: 1)  Zantac 150 Mg Tabs (Ranitidine hcl) .... Take 3 tablet by mouth once a day 2)  Multivitamins Tabs (Multiple vitamin) .... Take 1 tablet by mouth once a day  Patient Instructions: 1)  Copy sent to: Dr Marjory Lies 2)  Please schedule a follow-up appointment in 2 weeks after sleep study

## 2010-11-28 ENCOUNTER — Encounter (HOSPITAL_BASED_OUTPATIENT_CLINIC_OR_DEPARTMENT_OTHER): Payer: BC Managed Care – PPO

## 2010-11-30 LAB — CBC
HCT: 12.5 % — ABNORMAL LOW (ref 39.0–52.0)
HCT: 13.6 % — ABNORMAL LOW (ref 39.0–52.0)
Hemoglobin: 3.8 g/dL — CL (ref 13.0–17.0)
Hemoglobin: 4 g/dL — CL (ref 13.0–17.0)
MCHC: 30.2 g/dL (ref 30.0–36.0)
MCHC: 31.6 g/dL (ref 30.0–36.0)
MCV: 59 fL — ABNORMAL LOW (ref 78.0–100.0)
MCV: 68.7 fL — ABNORMAL LOW (ref 78.0–100.0)
MCV: 72.6 fL — ABNORMAL LOW (ref 78.0–100.0)
Platelets: 344 10*3/uL (ref 150–400)
Platelets: 369 10*3/uL (ref 150–400)
RBC: 2.16 MIL/uL — ABNORMAL LOW (ref 4.22–5.81)
RBC: 3.72 MIL/uL — ABNORMAL LOW (ref 4.22–5.81)
RDW: 26.7 % — ABNORMAL HIGH (ref 11.5–15.5)
RDW: 29.5 % — ABNORMAL HIGH (ref 11.5–15.5)
RDW: 32.1 % — ABNORMAL HIGH (ref 11.5–15.5)
WBC: 10.1 10*3/uL (ref 4.0–10.5)
WBC: 8.7 10*3/uL (ref 4.0–10.5)
WBC: 9.6 10*3/uL (ref 4.0–10.5)

## 2010-11-30 LAB — COMPREHENSIVE METABOLIC PANEL
Albumin: 4 g/dL (ref 3.5–5.2)
Alkaline Phosphatase: 48 U/L (ref 39–117)
BUN: 11 mg/dL (ref 6–23)
Creatinine, Ser: 0.95 mg/dL (ref 0.4–1.5)
Glucose, Bld: 120 mg/dL — ABNORMAL HIGH (ref 70–99)
Potassium: 3.8 mEq/L (ref 3.5–5.1)
Total Protein: 6.6 g/dL (ref 6.0–8.3)

## 2010-11-30 LAB — RETICULOCYTES
Retic Count, Absolute: 17.2 10*3/uL — ABNORMAL LOW (ref 19.0–186.0)
Retic Ct Pct: 0.8 % (ref 0.4–3.1)

## 2010-11-30 LAB — TYPE AND SCREEN: Antibody Screen: NEGATIVE

## 2010-11-30 LAB — POCT I-STAT, CHEM 8
Calcium, Ion: 1.11 mmol/L — ABNORMAL LOW (ref 1.12–1.32)
Chloride: 103 mEq/L (ref 96–112)
Glucose, Bld: 116 mg/dL — ABNORMAL HIGH (ref 70–99)
HCT: 15 % — ABNORMAL LOW (ref 39.0–52.0)
TCO2: 24 mmol/L (ref 0–100)

## 2010-11-30 LAB — BASIC METABOLIC PANEL
BUN: 8 mg/dL (ref 6–23)
Creatinine, Ser: 0.89 mg/dL (ref 0.4–1.5)
GFR calc Af Amer: >60 mL/min (ref >60)
GFR calc non Af Amer: >60 mL/min (ref >60)
Potassium: 3.8 mEq/L (ref 3.5–5.1)

## 2010-11-30 LAB — PROTIME-INR
INR: 1.08 (ref 0.00–1.49)
INR: 1.14 (ref 0.00–1.49)
Prothrombin Time: 13.9 seconds (ref 11.6–15.2)

## 2010-11-30 LAB — RETICULIN ANTIBODIES, IGA W TITER: Reticulin Ab, IgA: NEGATIVE

## 2010-11-30 LAB — VITAMIN B12: Vitamin B-12: 280 pg/mL (ref 211–911)

## 2010-11-30 LAB — LACTATE DEHYDROGENASE
LDH: 112 U/L (ref 94–250)
LDH: 90 U/L — ABNORMAL LOW (ref 94–250)

## 2010-11-30 LAB — PROTEIN ELECTROPH W RFLX QUANT IMMUNOGLOBULINS
Alpha-1-Globulin: 5.4 % — ABNORMAL HIGH (ref 2.9–4.9)
Alpha-2-Globulin: 9.8 % (ref 7.1–11.8)
Beta 2: 4.5 % (ref 3.2–6.5)
Gamma Globulin: 9.9 % — ABNORMAL LOW (ref 11.1–18.8)

## 2010-11-30 LAB — DIFFERENTIAL
Basophils Relative: 0 % (ref 0–1)
Eosinophils Absolute: 0.1 10*3/uL (ref 0.0–0.7)
Eosinophils Relative: 1 % (ref 0–5)
Lymphocytes Relative: 9 % — ABNORMAL LOW (ref 12–46)
Monocytes Absolute: 0.6 10*3/uL (ref 0.1–1.0)
Neutrophils Relative %: 83 % — ABNORMAL HIGH (ref 43–77)

## 2010-11-30 LAB — PREPARE RBC (CROSSMATCH)

## 2010-11-30 LAB — FOLATE: Folate: 15.8 ng/mL

## 2010-11-30 LAB — GLIADIN ANTIBODIES, SERUM: Gliadin IgG: 18 U/mL — ABNORMAL HIGH (ref <7)

## 2010-11-30 LAB — APTT: aPTT: 29 seconds (ref 24–37)

## 2010-11-30 LAB — TISSUE TRANSGLUTAMINASE, IGA: Tissue Transglutaminase Ab, IgA: 0.4 U/mL (ref <7)

## 2010-11-30 LAB — FERRITIN: Ferritin: 1 ng/mL — ABNORMAL LOW (ref 22–322)

## 2010-11-30 LAB — HAPTOGLOBIN: Haptoglobin: 127 mg/dL (ref 16–200)

## 2010-11-30 LAB — IRON AND TIBC
Iron: 14 ug/dL — ABNORMAL LOW (ref 42–135)
TIBC: 445 ug/dL — ABNORMAL HIGH (ref 215–435)

## 2010-11-30 LAB — HEMOGLOBINOPATHY EVALUATION: Hemoglobin Other: 0 % (ref 0.0–0.0)

## 2010-11-30 LAB — TSH: TSH: 1.642 u[IU]/mL (ref 0.350–4.500)

## 2010-12-06 ENCOUNTER — Ambulatory Visit (HOSPITAL_BASED_OUTPATIENT_CLINIC_OR_DEPARTMENT_OTHER): Payer: BC Managed Care – PPO | Attending: Pulmonary Disease

## 2010-12-06 DIAGNOSIS — G4733 Obstructive sleep apnea (adult) (pediatric): Secondary | ICD-10-CM | POA: Insufficient documentation

## 2010-12-06 DIAGNOSIS — R0989 Other specified symptoms and signs involving the circulatory and respiratory systems: Secondary | ICD-10-CM | POA: Insufficient documentation

## 2010-12-06 DIAGNOSIS — R0609 Other forms of dyspnea: Secondary | ICD-10-CM | POA: Insufficient documentation

## 2010-12-14 ENCOUNTER — Telehealth: Payer: Self-pay | Admitting: Pulmonary Disease

## 2010-12-14 DIAGNOSIS — R0609 Other forms of dyspnea: Secondary | ICD-10-CM

## 2010-12-14 DIAGNOSIS — G4733 Obstructive sleep apnea (adult) (pediatric): Secondary | ICD-10-CM

## 2010-12-14 DIAGNOSIS — R0989 Other specified symptoms and signs involving the circulatory and respiratory systems: Secondary | ICD-10-CM

## 2010-12-14 NOTE — Telephone Encounter (Signed)
Let him know PSG showed severe obstructive sleep apnea , stopped brathing > 100 times per hour Order sent to DME for BiPAP More discussion during oV

## 2010-12-14 NOTE — Procedures (Addendum)
NAME:  Benjamin Tapia, CANSLER NO.:  [id number]  MEDICAL RECORD NO.:  [id number]          PATIENT TYPE:  OUT  LOCATION:  SLEEP CENTER                 FACILITY:  [facility]  PHYSICIAN:  Rigoberto Noel, MD      DATE OF BIRTH:  05-14-1966  DATE OF STUDY:  12/06/2010                           NOCTURNAL POLYSOMNOGRAM  REFERRING PHYSICIAN:  Elizabeth Paulsen V. Miya Luviano  INDICATION FOR THE STUDY:  Mr. Krauser is a 45 year old morbidly obese gentleman with excessive daytime somnolence, loud snoring, witnessed apneas.  At the time of this study, he weighed 315 pounds with a height of 6 feet 2 inches, BMI of 40, neck size of 20 inches, Epworth sleepiness score was 18.  Bedtime medications were none.  This intervention polysomnogram was performed with sleep technologist in attendance.  EEG, EMG, EKG, and respiratory parameters were recorded. Sleep stages, arousals, limb movements, and respiratory data were scored according to criteria laid out by the American Academy of sleep medicine.  SLEEP ARCHITECTURE:  Lights out was at 10:08 p.m., lights on was at 5:49 a.m. CPAP was initiated at 1:03 a.m.  During the diagnostic portion, total sleep time was 130 minutes with a sleep period time of 164 minutes and a sleep efficiency of 75%.  Sleep latency was 10 minutes.  Wake after sleep onset was 35 minutes and latency to REM sleep was 146 minutes.  Sleep stages as percentage of total sleep time was N1 27%, N2 63%, N3 0% and REM sleep 10%.  Supine sleep was not noted.  During the titration portion, 86 minutes of REM sleep were noted and 26 minutes of REM supine sleep were noted.  Longest period of REM sleep was around 2:00 a.m.  AROUSALS DATA DURING THE DIAGNOSTIC PORTION:  The arousal index was 95 events per hour with most of the arousals being due to respiratory events.  During the titration portion, the arousal index was 18 events per hour.  He seemed to tolerate higher levels of CPAP quite  well.  RESPIRATORY DATA:  During the diagnostic portion, there were 57 obstructive apneas, 0 central apneas, 0 mixed apneas, and 162 hypopneas with an apnea/hypopnea index of 101 events per hour and the lowest desaturation of 45%!  Due to this degree of respiratory disturbance, CPAP was initiated at 6 cm and titrated up to 17 cm.  Respiratory events persisted at these levels of CPAP and due to high pressure requirement, BiPAP was initiated at 20/16 and titrated to a final level of 24/20 cm and a BiPAP level of 20/16 for 8 minutes, 2 obstructive apneas, 2 central apneas, and 5 hypopneas were noted with a lowest desaturation of 84% and the final level of 24/24, 15 minutes of sleep including 5.5 minutes of REM sleep, 2 central apneas and 1 hypopnea was noted with an AHI of 3.6 and lowest desaturation of 88%.  This appears to be the optimal level used during the study.  OXYGEN DATA:  The lowest oxygen desaturation was 45%.  During the diagnostic portion, he spent 58 minutes with saturation less than 88% and titration portion was 13 minutes with a saturation less than 88%.  LIMB MOVEMENT INDEX:  No significant limb movements were noted.  CARDIAC DATA:  No arrhythmias were noted.  The low heart rate was 35 beats per minute.  The high heart rate recorded was an artifact.  DISCUSSION:  Severe sleep disordered breathing with severe obstructive events were noted.  It seemed to be worse in the supine position.  Since high levels of CPAP was required, he was changed to BiPAP for difficulty exhaling and for increased pressures.  He tolerated the high pressures well and felt rested the morning after the study.  IMPRESSION: 1. Severe obstructive sleep apnea with hypopneas causing sleep     fragmentation and oxygen desaturation. 2. This was corrected optimally by a BiPAP of 24/20 with a large full-     face mask and humidity. 3. No significant limb movements were noted. 4. No evidence of cardiac  arrhythmias or behavioral disturbance during     sleep.  RECOMMENDATIONS: 1. The treatment options for this degree of sleep disordered breathing     include positive airway pressure and weight loss. 2. BiPAP can be initiated at 24/20 cm with a large full-face mask and     humidity.  Compliance should be monitored at this level. 3. Weight loss should be increased. 4. He should be cautioned against medications with sedative side     effects and against driving when sleepy.     Rigoberto Noel, MD Electronically Signed    RVA/MEDQ  D:  12/14/2010 09:09:06  T:  12/14/2010 56:38:75  Job:  [id number]

## 2010-12-15 ENCOUNTER — Encounter: Payer: Self-pay | Admitting: Pulmonary Disease

## 2010-12-15 NOTE — Telephone Encounter (Signed)
[id number] x1 

## 2010-12-16 NOTE — Telephone Encounter (Signed)
Pt phoned stated that he was returning [name] Robinson's call he can be reached at [phone].[place]

## 2010-12-16 NOTE — Telephone Encounter (Signed)
Pt informed of RA's rec and will follow up on Monday at scheduled appointment

## 2010-12-16 NOTE — Telephone Encounter (Signed)
Pt called again to speak with [name] he can be reached at [phone].Benjamin Tapia

## 2010-12-19 ENCOUNTER — Ambulatory Visit (INDEPENDENT_AMBULATORY_CARE_PROVIDER_SITE_OTHER): Payer: BC Managed Care – PPO | Admitting: Pulmonary Disease

## 2010-12-19 ENCOUNTER — Encounter: Payer: Self-pay | Admitting: Pulmonary Disease

## 2010-12-19 DIAGNOSIS — G4710 Hypersomnia, unspecified: Secondary | ICD-10-CM

## 2010-12-19 NOTE — Progress Notes (Signed)
  Subjective:    Patient ID: Benjamin Tapia, male    DOB: 22-Jun-1966, 45 y.o.   MRN: [id number]  HPI  45 year old morbidly obese  gentleman with excessive daytime somnolence, loud snoring, witnessed  apneas.  At the time of this study, he weighed 315 pounds with a height  of 6 feet 2 inches, BMI of 40, neck size of 20 inches, Epworth  sleepiness score was 18.  During the diagnostic portion, there were 57  obstructive apneas, 0 central apneas, 0 mixed apneas, and 162 hypopneas  with an apnea/hypopnea index of 101 events per hour and the lowest   desaturation of 45%!  Due to this degree of respiratory disturbance,  CPAP was initiated at 6 cm and titrated up to 17 cm. 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  Pt also denies any obvious fluctuation in symptoms with weather or environmental change or other alleviating or aggravating factors.    Pt denies any increase in rescue therapy over baseline, denies waking up needing it or having early am exacerbations or coughing/wheezing/ or dyspnea       Objective:   Physical Exam Gen. Pleasant, obese, in  no distress ENT - no lesions, no post nasal drip, class 3 airway Neck: No JVD, no thyromegaly, no carotid bruits Lungs: no use of accessory muscles, no dullness to percussion, clear without rales or rhonchi  Cardiovascular: Rhythm regular, heart sounds  normal, no murmurs or gallops, no peripheral edema Musculoskeletal: No deformities, no cyanosis or clubbing          Assessment & Plan:

## 2010-12-19 NOTE — Patient Instructions (Addendum)
We have sent order to DME company for BiPAP machine Call us with issues Send in card before your next visit

## 2010-12-20 NOTE — Assessment & Plan Note (Signed)
Implications of severe obstructive sleep apnea on cognitive & cardiovascular function were discussed Will initiate BiPAP at 20/15 & increase as tolerated to goal of 24/20 cm & chk donwload at thsi level Use full face mask with humidity  Weight loss encouraged, compliance with goal of at least 4-6 hrs every night is the expectation. Advised against medications with sedative side effects Cautioned against driving when sleepy - understanding that sleepiness will vary on a day to day basis

## 2011-03-03 ENCOUNTER — Encounter: Payer: Self-pay | Admitting: Pulmonary Disease

## 2011-09-18 ENCOUNTER — Encounter: Payer: Self-pay | Admitting: Family Medicine

## 2011-09-18 ENCOUNTER — Ambulatory Visit (INDEPENDENT_AMBULATORY_CARE_PROVIDER_SITE_OTHER): Payer: BC Managed Care – PPO | Admitting: Family Medicine

## 2011-09-18 ENCOUNTER — Telehealth: Payer: Self-pay | Admitting: Family Medicine

## 2011-09-18 VITALS — BP 130/90 | HR 65 | Temp 98.3°F | Wt 301.5 lb

## 2011-09-18 DIAGNOSIS — J069 Acute upper respiratory infection, unspecified: Secondary | ICD-10-CM

## 2011-09-18 MED ORDER — ALBUTEROL SULFATE HFA 108 (90 BASE) MCG/ACT IN AERS: 1.0000 | INHALATION_SPRAY | RESPIRATORY_TRACT | Status: DC | PRN

## 2011-09-18 MED ORDER — AZITHROMYCIN 250 MG PO TABS: ORAL_TABLET | ORAL | Status: AC

## 2011-09-18 NOTE — Telephone Encounter (Signed)
Triage Record Num: [id number] Operator: [name] Patient Name: Benjamin Tapia Call Date & Time: 09/18/2011 8:46:46AM Patient Phone: [phone] PCP: Patient Gender: Male PCP Fax : Patient DOB: [birth year] Practice Name: [facility] Reason for Call: Caller: [name]/Patient; PCP: Arnette Norris Crissie Sickles); CB#: [phone]; ; ; Call regarding Cough/Congestion; Pt is calling about having a productive cough that is "hanging on " x 1 week. Pt states his chest becomes tight intermittently. Pt has a hx of asthma and has been using his Albuterol inhaler q4h. No fever. Rn triaged and advised appt. Appt scheduled at 09:45 with Dr. Marjory Lies. Protocol(s) Used: Asthma - Adult Recommended Outcome per Protocol: Call Provider Immediately Reason for Outcome: Producing large amounts of green or yellow mucus when coughing or blowing nose. Care Advice: ~ IMMEDIATE ACTION Tell your provider if you: - Have been hospitalized or had ED care for your asthma in past month; - Have had 2 or more hospitalizations OR 3 or more ED visits for your asthma in past year; - Have used more than 2 canisters of rescue medication in past month; - Are currently taking or have had recent withdrawal of systemic corticosteroids; - Have another chronic illness in addition to asthma; - Are having serious problems coping with psychosocial issues. ~ 09/18/2011 10:08:59AM Page 1 of 1 [email or web address]

## 2011-09-18 NOTE — Telephone Encounter (Signed)
Patient saw Dr. Deborra Medina today.

## 2011-09-18 NOTE — Patient Instructions (Signed)
Take antibiotic as directed.  Drink lots of fluids.  Treat sympotmatically with Mucinex, nasal saline irrigation, and Tylenol/Ibuprofen. Also try claritin D or zyrtec D over the counter- two times a day as needed ( have to sign for them at pharmacy). You can use warm compresses.  Cough suppressant at night. Call if not improving as expected in 5-7 days.    

## 2011-09-18 NOTE — Progress Notes (Signed)
SUBJECTIVE:  Benjamin Tapia is a 46 y.o. male who complains of coryza, congestion, sore throat, productive cough and myalgias for 13 days. He denies a history of anorexia and chest pain and admits to a history of asthma. Patient denies smoke cigarettes.  Patient Active Problem List  Diagnoses  . ANEMIA, IRON DEFICIENCY  . CONJUNCTIVITIS, BACTERIAL, ACUTE  . ACUTE FRONTAL SINUSITIS  . ALLERGIC RHINITIS  . ASTHMA  . CELIAC DISEASE  . FATIGUE  . DYSPNEA ON EXERTION  . HYPERSOMNIA, ASSOCIATED WITH SLEEP APNEA   Past Medical History  Diagnosis Date  . ALLERGIC RHINITIS   . Asthma   . Celiac sprue    Past Surgical History  Procedure Date  . Tonsillectomy    History  Substance Use Topics  . Smoking status: Never Smoker   . Smokeless tobacco: Not on file  . Alcohol Use: No   Family History  Problem Relation Age of Onset  . Hypertension Mother   . Hypertension Father   . Other Father     HLD  . Leukemia Maternal Grandfather   . Stroke Paternal Grandfather    No Known Allergies Current Outpatient Prescriptions on File Prior to Visit  Medication Sig Dispense Refill  . cetirizine (ZYRTEC) 10 MG tablet Take 10 mg by mouth daily as needed.        . ergocalciferol (VITAMIN D2) 50000 UNITS capsule Take 50,000 Units by mouth once a week.        . ferrous gluconate (FERGON) 325 MG tablet Take 325 mg by mouth 2 (two) times daily.        . Multiple Vitamin (MULTIVITAMIN) tablet Take 1 tablet by mouth daily.        . ranitidine (ZANTAC) 150 MG capsule 3 capsules by mouth once daily        The PMH, PSH, Social History, Family History, Medications, and allergies have been reviewed in [facility], and have been updated if relevant.   OBJECTIVE: BP 130/90  Pulse 65  Temp(Src) 98.3 F (36.8 C) (Oral)  Wt 301 lb 8 oz (136.76 kg)  He appears well, vital signs are as noted. Ears normal.  Throat and pharynx normal.  Neck supple. No adenopathy in the neck. Nose is congested. Sinuses non tender. The  chest is clear, without wheezes or rales.  ASSESSMENT:  bronchitis  PLAN: Zpack as directed, continue proair as needed along with mucinex prn. Symptomatic therapy suggested: push fluids, rest and return office visit prn if symptoms persist or worsen. Call or return to clinic prn if these symptoms worsen or fail to improve as anticipated.

## 2012-04-30 ENCOUNTER — Other Ambulatory Visit: Payer: Self-pay | Admitting: Family Medicine

## 2013-04-30 ENCOUNTER — Other Ambulatory Visit: Payer: Self-pay | Admitting: Family Medicine

## 2013-05-01 NOTE — Telephone Encounter (Signed)
Refill on ventolin has expired and request for more refills denied, pt not seen since 1/13, with no upcoming appts.

## 2013-05-06 ENCOUNTER — Other Ambulatory Visit: Payer: Self-pay | Admitting: Family Medicine

## 2013-05-07 NOTE — Telephone Encounter (Signed)
Refill request for ventolin.  Pt has not been seen here since 08/2011.

## 2013-05-07 NOTE — Telephone Encounter (Signed)
Ok to refill one time.  Needs appt for further refills.

## 2013-11-04 ENCOUNTER — Other Ambulatory Visit: Payer: Self-pay | Admitting: Family Medicine

## 2014-03-19 ENCOUNTER — Other Ambulatory Visit: Payer: Self-pay | Admitting: Family Medicine

## 2014-03-19 ENCOUNTER — Other Ambulatory Visit: Payer: Self-pay

## 2014-03-19 MED ORDER — ALBUTEROL SULFATE HFA 108 (90 BASE) MCG/ACT IN AERS: INHALATION_SPRAY | RESPIRATORY_TRACT | Status: DC

## 2014-03-19 NOTE — Telephone Encounter (Signed)
Pt left v/m requesting refill ventolin inhaler to [facility]. Pt last seen 09/18/2011 and has CPX scheduled on 04/10/14. Is it OK to refill? Pt request cb.

## 2014-03-25 ENCOUNTER — Other Ambulatory Visit: Payer: Self-pay | Admitting: Family Medicine

## 2014-03-25 DIAGNOSIS — Z0000 Encounter for general adult medical examination without abnormal findings: Secondary | ICD-10-CM | POA: Insufficient documentation

## 2014-03-25 DIAGNOSIS — Z136 Encounter for screening for cardiovascular disorders: Secondary | ICD-10-CM

## 2014-03-25 DIAGNOSIS — D509 Iron deficiency anemia, unspecified: Secondary | ICD-10-CM

## 2014-04-03 ENCOUNTER — Other Ambulatory Visit (INDEPENDENT_AMBULATORY_CARE_PROVIDER_SITE_OTHER): Payer: BC Managed Care – PPO

## 2014-04-03 DIAGNOSIS — Z13220 Encounter for screening for lipoid disorders: Secondary | ICD-10-CM

## 2014-04-03 DIAGNOSIS — D509 Iron deficiency anemia, unspecified: Secondary | ICD-10-CM

## 2014-04-03 DIAGNOSIS — Z125 Encounter for screening for malignant neoplasm of prostate: Secondary | ICD-10-CM

## 2014-04-03 DIAGNOSIS — Z0000 Encounter for general adult medical examination without abnormal findings: Secondary | ICD-10-CM

## 2014-04-04 LAB — LIPID PANEL
CHOL/HDL RATIO: 4.8 ratio (ref 0.0–5.0)
Cholesterol, Total: 189 mg/dL (ref 100–199)
HDL: 39 mg/dL — ABNORMAL LOW (ref >39)
LDL Calculated: 128 mg/dL — ABNORMAL HIGH (ref 0–99)
Triglycerides: 109 mg/dL (ref 0–149)
VLDL Cholesterol Cal: 22 mg/dL (ref 5–40)

## 2014-04-04 LAB — CBC WITH DIFFERENTIAL/PLATELET
BASOS: 1 %
Basophils Absolute: 0.1 10*3/uL (ref 0.0–0.2)
EOS: 5 %
Eosinophils Absolute: 0.3 10*3/uL (ref 0.0–0.4)
HEMATOCRIT: 41.7 % (ref 37.5–51.0)
HEMOGLOBIN: 14 g/dL (ref 12.6–17.7)
Immature Grans (Abs): 0 10*3/uL (ref 0.0–0.1)
Immature Granulocytes: 0 %
LYMPHS ABS: 1 10*3/uL (ref 0.7–3.1)
Lymphs: 20 %
MCH: 28.9 pg (ref 26.6–33.0)
MCHC: 33.6 g/dL (ref 31.5–35.7)
MCV: 86 fL (ref 79–97)
MONOCYTES: 11 %
Monocytes Absolute: 0.6 10*3/uL (ref 0.1–0.9)
NEUTROS ABS: 3.2 10*3/uL (ref 1.4–7.0)
Neutrophils Relative %: 63 %
RBC: 4.85 x10E6/uL (ref 4.14–5.80)
RDW: 13.1 % (ref 12.3–15.4)
WBC: 5.1 10*3/uL (ref 3.4–10.8)

## 2014-04-04 LAB — COMPREHENSIVE METABOLIC PANEL
ALBUMIN: 4.4 g/dL (ref 3.5–5.5)
ALT: 27 IU/L (ref 0–44)
AST: 16 IU/L (ref 0–40)
Albumin/Globulin Ratio: 1.8 (ref 1.1–2.5)
Alkaline Phosphatase: 63 IU/L (ref 39–117)
BILIRUBIN TOTAL: 0.4 mg/dL (ref 0.0–1.2)
BUN / CREAT RATIO: 12 (ref 9–20)
BUN: 12 mg/dL (ref 6–24)
CHLORIDE: 100 mmol/L (ref 97–108)
CO2: 22 mmol/L (ref 18–29)
CREATININE: 1 mg/dL (ref 0.76–1.27)
Calcium: 9.6 mg/dL (ref 8.7–10.2)
GFR calc non Af Amer: 90 mL/min/{1.73_m2} (ref >59)
GFR, EST AFRICAN AMERICAN: 104 mL/min/{1.73_m2} (ref >59)
Globulin, Total: 2.4 g/dL (ref 1.5–4.5)
Glucose: 102 mg/dL — ABNORMAL HIGH (ref 65–99)
Potassium: 4.6 mmol/L (ref 3.5–5.2)
Sodium: 140 mmol/L (ref 134–144)
TOTAL PROTEIN: 6.8 g/dL (ref 6.0–8.5)

## 2014-04-04 LAB — PSA: PSA: 0.3 ng/mL (ref 0.0–4.0)

## 2014-04-10 ENCOUNTER — Ambulatory Visit (INDEPENDENT_AMBULATORY_CARE_PROVIDER_SITE_OTHER): Payer: BC Managed Care – PPO | Admitting: Family Medicine

## 2014-04-10 ENCOUNTER — Encounter: Payer: Self-pay | Admitting: Family Medicine

## 2014-04-10 ENCOUNTER — Encounter (INDEPENDENT_AMBULATORY_CARE_PROVIDER_SITE_OTHER): Payer: Self-pay

## 2014-04-10 VITALS — BP 128/84 | HR 82 | Temp 99.0°F | Ht 72.5 in | Wt 308.8 lb

## 2014-04-10 DIAGNOSIS — G4730 Sleep apnea, unspecified: Secondary | ICD-10-CM

## 2014-04-10 DIAGNOSIS — K900 Celiac disease: Secondary | ICD-10-CM

## 2014-04-10 DIAGNOSIS — G4710 Hypersomnia, unspecified: Secondary | ICD-10-CM

## 2014-04-10 DIAGNOSIS — E669 Obesity, unspecified: Secondary | ICD-10-CM | POA: Insufficient documentation

## 2014-04-10 DIAGNOSIS — Z0000 Encounter for general adult medical examination without abnormal findings: Secondary | ICD-10-CM

## 2014-04-10 MED ORDER — ALBUTEROL SULFATE HFA 108 (90 BASE) MCG/ACT IN AERS: INHALATION_SPRAY | RESPIRATORY_TRACT | Status: DC

## 2014-04-10 NOTE — Assessment & Plan Note (Signed)
Deteriorated. He is motivated to make changes.

## 2014-04-10 NOTE — Progress Notes (Signed)
Pre visit review using our clinic review tool, if applicable. No additional management support is needed unless otherwise documented below in the visit note. 

## 2014-04-10 NOTE — Patient Instructions (Signed)
Great to see you. I know you can make the changes we talked about today. Your blood work looked great.

## 2014-04-10 NOTE — Progress Notes (Signed)
Subjective:   Patient ID: Benjamin Tapia, male    DOB: [birth year], 48 y.o.   MRN: [id number]  Benjamin Tapia is a pleasant 48 y.o. year old male who presents to clinic today with Annual Exam  on 04/10/2014  HPI: He has not been seen for routine care since 2010.  OSA- last saw Dr. Elsworth Soho on 12/19/10- note reviewed.  Felt OSA was severe and started Bipap and increased as tolerated. Advised weight loss.  Feels so much better - wears CPAP every night.  Obesity- regained some of the weight he last in 2013.  He knows that his portions are too large and he does not exercise. Wt Readings from Last 3 Encounters:  04/10/14 308 lb 12 oz (140.048 kg)  09/18/11 301 lb 8 oz (136.76 kg)  12/19/10 320 lb (145.151 kg)    Celiac- diagnosed when he saw me for fatigue, DOE in 06/2009.  Has been doing fine from a Celiac stand point.  Learned how to adjust his diet. Lab Results  Component Value Date   WBC 5.1 04/03/2014   HGB 14.0 04/03/2014   HCT 41.7 04/03/2014   MCV 86 04/03/2014   PLT 331.0 10/17/2010   Lab Results  Component Value Date   CHOL 131 07/09/2009   HDL 39* 04/03/2014   LDLCALC 128* 04/03/2014   TRIG 109 04/03/2014   CHOLHDL 4.8 04/03/2014   Lab Results  Component Value Date   CREATININE 1.00 04/03/2014   Lab Results  Component Value Date   PSA 0.3 04/03/2014   Current Outpatient Prescriptions on File Prior to Visit  Medication Sig Dispense Refill  . cetirizine (ZYRTEC) 10 MG tablet Take 10 mg by mouth daily as needed.        . ferrous gluconate (FERGON) 325 MG tablet Take 325 mg by mouth 2 (two) times daily.        . ranitidine (ZANTAC) 150 MG capsule 3 capsules by mouth once daily        No current facility-administered medications on file prior to visit.    No Known Allergies  Past Medical History  Diagnosis Date  . ALLERGIC RHINITIS   . Asthma   . Celiac sprue     Past Surgical History  Procedure Laterality Date  . Tonsillectomy      Family History  Problem Relation Age of  Onset  . Hypertension Mother   . Hypertension Father   . Other Father     HLD  . Leukemia Maternal Grandfather   . Stroke Paternal Grandfather     History   Social History  . Marital Status: Married    Spouse Name: N/A    Number of Children: 2  . Years of Education: N/A   Occupational History  . 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 Smoking status: Never Smoker   . Smokeless tobacco: Not on file  . Alcohol Use: No  . Drug Use: No  . Sexual Activity: Not on file   Other Topics Concern  . Not on file   Social History Narrative   Regular exercise - no   2 daughters 8 and 35 in [place]  The PMH, [place], Social History, Family History, Medications, and allergies have been reviewed in [facility], and have been updated if relevant.   Review of Systems    See HPI Patient reports no  vision/ hearing changes,anorexia, weight change, fever ,adenopathy, persistant / recurrent hoarseness, swallowing  issues, chest pain, edema,persistant / recurrent cough, hemoptysis, dyspnea(rest, exertional, paroxysmal nocturnal), gastrointestinal  bleeding (melena, rectal bleeding), abdominal pain, excessive heart burn, GU symptoms(dysuria, hematuria, pyuria, voiding/incontinence  Issues) syncope, focal weakness, severe memory loss, concerning skin lesions, depression, anxiety, abnormal bruising/bleeding, major joint swelling.    Objective:    BP 128/84  Pulse 82  Temp(Src) 99 F (37.2 C) (Oral)  Ht 6' 0.5" (1.842 m)  Wt 308 lb 12 oz (140.048 kg)  BMI 41.28 kg/m2  SpO2 96%   Physical Exam General:  overweght male in NAD Eyes:  PERRL Ears:  External ear exam shows no significant lesions or deformities.  Otoscopic examination reveals clear canals, tympanic membranes are intact bilaterally without bulging, retraction, inflammation or discharge. Hearing is grossly normal bilaterally. Nose:  External nasal examination shows no deformity or  inflammation. Nasal mucosa are pink and moist without lesions or exudates. Mouth:  Oral mucosa and oropharynx without lesions or exudates.  Teeth in good repair. Neck:  no carotid bruit or thyromegaly no cervical or supraclavicular lymphadenopathy  Lungs:  Normal respiratory effort, chest expands symmetrically. Lungs are clear to auscultation, no crackles or wheezes. Heart:  Normal rate and regular rhythm. S1 and S2 normal without gallop, murmur, click, rub or other extra sounds. Abdomen:  Bowel sounds positive,abdomen soft and non-tender without masses, organomegaly or hernias noted. Pulses:  R and L posterior tibial pulses are full and equal bilaterally  Extremities:  no edema         Assessment & Plan:   Routine general medical examination at a health care facility  HYPERSOMNIA, ASSOCIATED WITH SLEEP APNEA  Celiac disease  Obesity, unspecified No Follow-up on file.

## 2014-04-10 NOTE — Assessment & Plan Note (Signed)
Reviewed preventive care protocols, scheduled due services, and updated immunizations Discussed nutrition, exercise, diet, and healthy lifestyle.  

## 2014-04-10 NOTE — Assessment & Plan Note (Signed)
Wearing Bipap

## 2014-07-13 ENCOUNTER — Other Ambulatory Visit: Payer: Self-pay | Admitting: Family Medicine

## 2014-09-07 ENCOUNTER — Other Ambulatory Visit: Payer: Self-pay | Admitting: Family Medicine

## 2014-11-19 ENCOUNTER — Other Ambulatory Visit: Payer: Self-pay | Admitting: Family Medicine

## 2015-07-13 ENCOUNTER — Other Ambulatory Visit: Payer: Self-pay | Admitting: Family Medicine

## 2015-07-14 NOTE — Telephone Encounter (Signed)
See note from pharmacy in refill request. Pt has not been seen for f/u or acute since 03/2014

## 2015-10-13 ENCOUNTER — Other Ambulatory Visit: Payer: Self-pay | Admitting: Family Medicine

## 2015-10-13 DIAGNOSIS — Z0000 Encounter for general adult medical examination without abnormal findings: Secondary | ICD-10-CM

## 2015-10-15 ENCOUNTER — Other Ambulatory Visit (INDEPENDENT_AMBULATORY_CARE_PROVIDER_SITE_OTHER): Payer: BC Managed Care – PPO

## 2015-10-15 DIAGNOSIS — Z0000 Encounter for general adult medical examination without abnormal findings: Secondary | ICD-10-CM

## 2015-10-15 LAB — CBC WITH DIFFERENTIAL/PLATELET
BASOS ABS: 0 10*3/uL (ref 0.0–0.1)
Basophils Relative: 0.4 % (ref 0.0–3.0)
EOS ABS: 0.2 10*3/uL (ref 0.0–0.7)
Eosinophils Relative: 3.3 % (ref 0.0–5.0)
HCT: 41.1 % (ref 39.0–52.0)
Hemoglobin: 13.7 g/dL (ref 13.0–17.0)
LYMPHS ABS: 1.2 10*3/uL (ref 0.7–4.0)
Lymphocytes Relative: 15.7 % (ref 12.0–46.0)
MCHC: 33.4 g/dL (ref 30.0–36.0)
MCV: 86.7 fl (ref 78.0–100.0)
Monocytes Absolute: 0.6 10*3/uL (ref 0.1–1.0)
Monocytes Relative: 8.1 % (ref 3.0–12.0)
NEUTROS ABS: 5.4 10*3/uL (ref 1.4–7.7)
NEUTROS PCT: 72.5 % (ref 43.0–77.0)
Platelets: 296 10*3/uL (ref 150.0–400.0)
RBC: 4.74 Mil/uL (ref 4.22–5.81)
RDW: 13.1 % (ref 11.5–15.5)
WBC: 7.4 10*3/uL (ref 4.0–10.5)

## 2015-10-15 LAB — LIPID PANEL
CHOL/HDL RATIO: 5
Cholesterol: 193 mg/dL (ref 0–200)
HDL: 37.6 mg/dL — AB (ref >39.00)
LDL Cholesterol: 130 mg/dL — ABNORMAL HIGH (ref 0–99)
NONHDL: 155.89
Triglycerides: 128 mg/dL (ref 0.0–149.0)
VLDL: 25.6 mg/dL (ref 0.0–40.0)

## 2015-10-15 LAB — COMPREHENSIVE METABOLIC PANEL WITH GFR
ALT: 25 U/L (ref 0–53)
AST: 18 U/L (ref 0–37)
Albumin: 4.5 g/dL (ref 3.5–5.2)
Alkaline Phosphatase: 57 U/L (ref 39–117)
BUN: 14 mg/dL (ref 6–23)
CO2: 29 meq/L (ref 19–32)
Calcium: 9.5 mg/dL (ref 8.4–10.5)
Chloride: 102 meq/L (ref 96–112)
Creatinine, Ser: 1 mg/dL (ref 0.40–1.50)
GFR: 84.79 mL/min
Glucose, Bld: 113 mg/dL — ABNORMAL HIGH (ref 70–99)
Potassium: 4.4 meq/L (ref 3.5–5.1)
Sodium: 139 meq/L (ref 135–145)
Total Bilirubin: 0.5 mg/dL (ref 0.2–1.2)
Total Protein: 7.1 g/dL (ref 6.0–8.3)

## 2015-10-15 LAB — PSA: PSA: 0.22 ng/mL (ref 0.10–4.00)

## 2015-10-27 ENCOUNTER — Other Ambulatory Visit: Payer: Self-pay | Admitting: Family Medicine

## 2015-10-27 MED ORDER — ALBUTEROL SULFATE HFA 108 (90 BASE) MCG/ACT IN AERS: INHALATION_SPRAY | RESPIRATORY_TRACT | Status: DC

## 2015-10-27 NOTE — Telephone Encounter (Signed)
Pt left v/m requesting refill ventolin inhaler; refill was denied earlier; pt has appt CPX scheduled for 11/04/15; last annual exam on 04/10/14. Pt request cb. [employer].

## 2015-10-27 NOTE — Telephone Encounter (Signed)
Ok to refill as requested 

## 2015-10-27 NOTE — Addendum Note (Signed)
Addended by: Modena Nunnery on: 10/27/2015 10:58 AM   Modules accepted: Orders

## 2015-10-29 ENCOUNTER — Telehealth: Payer: Self-pay | Admitting: *Deleted

## 2015-10-29 NOTE — Telephone Encounter (Signed)
Form received indicating PA required for pts ventolin. Pt has not been seen since 03/2014, but has an upcoming appt and PA will follow after appt

## 2015-11-04 ENCOUNTER — Encounter: Payer: Self-pay | Admitting: Family Medicine

## 2015-11-04 ENCOUNTER — Ambulatory Visit (INDEPENDENT_AMBULATORY_CARE_PROVIDER_SITE_OTHER): Payer: BC Managed Care – PPO | Admitting: Family Medicine

## 2015-11-04 VITALS — BP 148/82 | HR 76 | Temp 98.1°F | Wt 338.5 lb

## 2015-11-04 DIAGNOSIS — Z0000 Encounter for general adult medical examination without abnormal findings: Secondary | ICD-10-CM | POA: Diagnosis not present

## 2015-11-04 DIAGNOSIS — L409 Psoriasis, unspecified: Secondary | ICD-10-CM | POA: Insufficient documentation

## 2015-11-04 DIAGNOSIS — IMO0001 Reserved for inherently not codable concepts without codable children: Secondary | ICD-10-CM | POA: Insufficient documentation

## 2015-11-04 DIAGNOSIS — R21 Rash and other nonspecific skin eruption: Secondary | ICD-10-CM

## 2015-11-04 DIAGNOSIS — K900 Celiac disease: Secondary | ICD-10-CM | POA: Diagnosis not present

## 2015-11-04 DIAGNOSIS — R030 Elevated blood-pressure reading, without diagnosis of hypertension: Secondary | ICD-10-CM | POA: Diagnosis not present

## 2015-11-04 DIAGNOSIS — G4710 Hypersomnia, unspecified: Secondary | ICD-10-CM | POA: Diagnosis not present

## 2015-11-04 DIAGNOSIS — E669 Obesity, unspecified: Secondary | ICD-10-CM

## 2015-11-04 DIAGNOSIS — G4730 Sleep apnea, unspecified: Secondary | ICD-10-CM

## 2015-11-04 DIAGNOSIS — J4520 Mild intermittent asthma, uncomplicated: Secondary | ICD-10-CM

## 2015-11-04 MED ORDER — FLUOCINONIDE-E 0.05 % EX CREA: 1.0000 "application " | TOPICAL_CREAM | Freq: Two times a day (BID) | CUTANEOUS | Status: DC

## 2015-11-04 MED ORDER — ALBUTEROL SULFATE HFA 108 (90 BASE) MCG/ACT IN AERS: INHALATION_SPRAY | RESPIRATORY_TRACT | Status: DC

## 2015-11-04 NOTE — Assessment & Plan Note (Addendum)
Deteriorated. Declined referral to nutritionist- "I know what to do."  Sedentary and eating large portions. Advised 30 minutes of walking daily at lunch and DASH diet- handout given to pt.

## 2015-11-04 NOTE — Assessment & Plan Note (Signed)
Reviewed preventive care protocols, scheduled due services, and updated immunizations Discussed nutrition, exercise, diet, and healthy lifestyle.  

## 2015-11-04 NOTE — Patient Instructions (Addendum)
Good to see you. Cut back on salt (DASH diet).  Start exercising.  Check your blood pressure at home and call me.  Please apply lidex cream to areas twice daily for no more than 10 days without updating me.    DASH Eating Plan DASH stands for "Dietary Approaches to Stop Hypertension." The DASH eating plan is a healthy eating plan that has been shown to reduce high blood pressure (hypertension). Additional health benefits may include reducing the risk of type 2 diabetes mellitus, heart disease, and stroke. The DASH eating plan may also help with weight loss. WHAT DO I NEED TO KNOW ABOUT THE DASH EATING PLAN? For the DASH eating plan, you will follow these general guidelines:  Choose foods with a percent daily value for sodium of less than 5% (as listed on the food label).  Use salt-free seasonings or herbs instead of table salt or sea salt.  Check with your health care provider or pharmacist before using salt substitutes.  Eat lower-sodium products, often labeled as "lower sodium" or "no salt added."  Eat fresh foods.  Eat more vegetables, fruits, and low-fat dairy products.  Choose whole grains. Look for the word "whole" as the first word in the ingredient list.  Choose fish and skinless chicken or [place] more often than red meat. Limit fish, poultry, and meat to 6 oz (170 g) each day.  Limit sweets, desserts, sugars, and sugary drinks.  Choose heart-healthy fats.  Limit cheese to 1 oz (28 g) per day.  Eat more home-cooked food and less restaurant, buffet, and fast food.  Limit fried foods.  Cook foods using methods other than frying.  Limit canned vegetables. If you do use them, rinse them well to decrease the sodium.  When eating at a restaurant, ask that your food be prepared with less salt, or no salt if possible. WHAT FOODS CAN I EAT? Seek help from a dietitian for individual calorie needs. Grains Whole grain or whole wheat bread. Brown rice. Whole grain or whole  wheat pasta. Quinoa, bulgur, and whole grain cereals. Low-sodium cereals. Corn or whole wheat flour tortillas. Whole grain cornbread. Whole grain crackers. Low-sodium crackers. Vegetables Fresh or frozen vegetables (raw, steamed, roasted, or grilled). Low-sodium or reduced-sodium tomato and vegetable juices. Low-sodium or reduced-sodium tomato sauce and paste. Low-sodium or reduced-sodium canned vegetables.  Fruits All fresh, canned (in natural juice), or frozen fruits. Meat and Other Protein Products Ground beef (85% or leaner), grass-fed beef, or beef trimmed of fat. Skinless chicken or [place]. Ground chicken or [place]. Pork trimmed of fat. All fish and seafood. Eggs. Dried beans, peas, or lentils. Unsalted nuts and seeds. Unsalted canned beans. Dairy Low-fat dairy products, such as skim or 1% milk, 2% or reduced-fat cheeses, low-fat ricotta or cottage cheese, or plain low-fat yogurt. Low-sodium or reduced-sodium cheeses. Fats and Oils Tub margarines without trans fats. Light or reduced-fat mayonnaise and salad dressings (reduced sodium). Avocado. Safflower, olive, or canola oils. Natural peanut or almond butter. Other Unsalted popcorn and pretzels. The items listed above may not be a complete list of recommended foods or beverages. Contact your dietitian for more options. WHAT FOODS ARE NOT RECOMMENDED? Grains White bread. White pasta. White rice. Refined cornbread. Bagels and croissants. Crackers that contain trans fat. Vegetables Creamed or fried vegetables. Vegetables in a cheese sauce. Regular canned vegetables. Regular canned tomato sauce and paste. Regular tomato and vegetable juices. Fruits Dried fruits. Canned fruit in light or heavy syrup. Fruit juice. Meat and Other Protein  Products Fatty cuts of meat. Ribs, chicken wings, bacon, sausage, bologna, salami, chitterlings, fatback, hot dogs, bratwurst, and packaged luncheon meats. Salted nuts and seeds. Canned beans with  salt. Dairy Whole or 2% milk, cream, half-and-half, and cream cheese. Whole-fat or sweetened yogurt. Full-fat cheeses or blue cheese. Nondairy creamers and whipped toppings. Processed cheese, cheese spreads, or cheese curds. Condiments Onion and garlic salt, seasoned salt, table salt, and sea salt. Canned and packaged gravies. Worcestershire sauce. Tartar sauce. Barbecue sauce. Teriyaki sauce. Soy sauce, including reduced sodium. Steak sauce. Fish sauce. Oyster sauce. Cocktail sauce. Horseradish. Ketchup and mustard. Meat flavorings and tenderizers. Bouillon cubes. Hot sauce. Tabasco sauce. Marinades. Taco seasonings. Relishes. Fats and Oils Butter, stick margarine, lard, shortening, ghee, and bacon fat. Coconut, palm kernel, or palm oils. Regular salad dressings. Other Pickles and olives. Salted popcorn and pretzels. The items listed above may not be a complete list of foods and beverages to avoid. Contact your dietitian for more information. WHERE CAN I FIND MORE INFORMATION? National Heart, Lung, and Blood Institute: travelstabloid.com   This information is not intended to replace advice given to you by your health care provider. Make sure you discuss any questions you have with your health care provider.   Document Released: 08/03/2011 Document Revised: 09/04/2014 Document Reviewed: 06/18/2013 Elsevier Interactive Patient Education [employer].

## 2015-11-04 NOTE — Telephone Encounter (Signed)
PA completed via cover my meds. Immediate response received.  indicates pt does not meet criteria for ventolin. Spoke with pt and proair sent to pharmacy

## 2015-11-04 NOTE — Progress Notes (Signed)
Subjective:   Patient ID: Benjamin Tapia, male    DOB: [birth year], 50 y.o.   MRN: [id number]  Benjamin Tapia is a pleasant 50 y.o. year old male who presents to clinic today with Annual Exam; Rash; and Elbow Pain  on 11/04/2015  HPI: He has not been seen for routine care since 2015.  Rash- back of legs bilaterally.  Itchy.  Noticed it a few days ago.  He is allergic to his dog who does sit on the cough.  Has not tried any topical or oral rxs for this yet.  Does have h/o psoriasis.   OSA- diagnosed by  Dr. Elsworth Soho in 2012.  Felt OSA was severe and started Bipap and increased as tolerated. Advised weight loss.  Feels so much better - wears CPAP every night.  Obesity- regained a lot of his weight back that he lost several years ago.  BP elevated today.  Denies any SOB, CP or HA.  No changes in vision. Wt Readings from Last 3 Encounters:  11/04/15 338 lb 8 oz (153.543 kg)  04/10/14 308 lb 12 oz (140.048 kg)  09/18/11 301 lb 8 oz (136.76 kg)    Celiac- diagnosed when he saw me for fatigue, DOE in 06/2009.  Has been doing fine from a Celiac stand point.  Learned how to adjust his diet.  Does still eat gluten on occasion.  Lab Results  Component Value Date   WBC 7.4 10/15/2015   HGB 13.7 10/15/2015   HCT 41.1 10/15/2015   MCV 86.7 10/15/2015   PLT 296.0 10/15/2015   Lab Results  Component Value Date   CHOL 193 10/15/2015   HDL 37.60* 10/15/2015   LDLCALC 130* 10/15/2015   TRIG 128.0 10/15/2015   CHOLHDL 5 10/15/2015   Lab Results  Component Value Date   CREATININE 1.00 10/15/2015   Lab Results  Component Value Date   PSA 0.22 10/15/2015   PSA 0.3 04/03/2014   Current Outpatient Prescriptions on File Prior to Visit  Medication Sig Dispense Refill  . cetirizine (ZYRTEC) 10 MG tablet Take 10 mg by mouth daily as needed.      . ferrous gluconate (FERGON) 325 MG tablet Take 325 mg by mouth 2 (two) times daily.      . ranitidine (ZANTAC) 150 MG capsule 3 capsules by mouth once daily       No current facility-administered medications on file prior to visit.    No Known Allergies  Past Medical History  Diagnosis Date  . ALLERGIC RHINITIS   . Asthma   . Celiac sprue     Past Surgical History  Procedure Laterality Date  . Tonsillectomy      Family History  Problem Relation Age of Onset  . Hypertension Mother   . Hypertension Father   . Other Father     HLD  . Leukemia Maternal Grandfather   . Stroke Paternal Grandfather     Social History   Social History  . Marital Status: Married    Spouse Name: N/A  . Number of Children: 2  . Years of Education: N/A   Occupational History  . 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 Smoking status: Never Smoker   . Smokeless tobacco: Not on file  . Alcohol Use: No  . Drug Use: No  . Sexual Activity: Not on file   Other Topics Concern  . Not on file   Social History  Narrative   Regular exercise - no   2 daughters 40 and 3 in [place]  The PMH, [place], Social History, Family History, Medications, and allergies have been reviewed in [facility], and have been updated if relevant.   Review of Systems  Constitutional: Negative.   HENT: Negative.   Eyes: Negative.   Respiratory: Negative.   Cardiovascular: Negative.   Gastrointestinal: Negative.   Endocrine: Negative.   Genitourinary: Negative.   Musculoskeletal: Negative.   Skin: Positive for rash.  Allergic/Immunologic: Negative.   Neurological: Negative.   Hematological: Negative.   Psychiatric/Behavioral: Negative.   All other systems reviewed and are negative.    Objective:    BP 152/82 mmHg  Pulse 76  Temp(Src) 98.1 F (36.7 C) (Oral)  Wt 338 lb 8 oz (153.543 kg)  SpO2 95%   Physical Exam General:  overweght male in NAD Eyes:  PERRL Ears:  External ear exam shows no significant lesions or deformities.  Otoscopic examination reveals clear canals, tympanic membranes are intact bilaterally  without bulging, retraction, inflammation or discharge. Hearing is grossly normal bilaterally. Nose:  External nasal examination shows no deformity or inflammation. Nasal mucosa are pink and moist without lesions or exudates. Mouth:  Oral mucosa and oropharynx without lesions or exudates.  Teeth in good repair. Neck:  no carotid bruit or thyromegaly no cervical or supraclavicular lymphadenopathy  Lungs:  Normal respiratory effort, chest expands symmetrically. Lungs are clear to auscultation, no crackles or wheezes. Heart:  Normal rate and regular rhythm. S1 and S2 normal without gallop, murmur, click, rub or other extra sounds. Abdomen:  Bowel sounds positive,abdomen soft and non-tender without masses, organomegaly or hernias noted. Pulses:  R and L posterior tibial pulses are full and equal bilaterally  Extremities:  no edema  Skin: raised, salmon colored macular/papular rash on back of lower legs bilaterally        Assessment & Plan:   Routine general medical examination at a health care facility  Obesity, unspecified  Hypersomnia with sleep apnea  Celiac disease  Elevated blood pressure No Follow-up on file.

## 2015-11-04 NOTE — Assessment & Plan Note (Signed)
Compliant with CPAP 

## 2015-11-04 NOTE — Assessment & Plan Note (Signed)
Stable on current rx. No changes made today.

## 2015-11-04 NOTE — Assessment & Plan Note (Signed)
Contact/allergic dermatis vs psoriasis treat with 10 days of lidex twice daily. Call or return to clinic prn if these symptoms worsen or fail to improve as anticipated. The patient indicates understanding of these issues and agrees with the plan.

## 2015-11-04 NOTE — Assessment & Plan Note (Signed)
New- likely due to weight gain and diet. DASH diet. Check BP at home- he will call me in 2 weeks. He is currently asymptomatic and feels part of it is white coat HTN.

## 2015-11-04 NOTE — Progress Notes (Signed)
Pre visit review using our clinic review tool, if applicable. No additional management support is needed unless otherwise documented below in the visit note. 

## 2016-01-03 ENCOUNTER — Other Ambulatory Visit: Payer: Self-pay | Admitting: Family Medicine

## 2016-12-28 ENCOUNTER — Other Ambulatory Visit: Payer: Self-pay | Admitting: Family Medicine

## 2017-01-28 ENCOUNTER — Other Ambulatory Visit: Payer: Self-pay | Admitting: Family Medicine

## 2017-02-26 ENCOUNTER — Other Ambulatory Visit: Payer: Self-pay | Admitting: Family Medicine

## 2017-03-12 ENCOUNTER — Other Ambulatory Visit: Payer: Self-pay | Admitting: Family Medicine

## 2017-04-14 ENCOUNTER — Other Ambulatory Visit: Payer: Self-pay | Admitting: Family Medicine

## 2017-05-09 ENCOUNTER — Other Ambulatory Visit: Payer: Self-pay | Admitting: Family Medicine

## 2017-05-24 ENCOUNTER — Other Ambulatory Visit: Payer: Self-pay | Admitting: Family Medicine

## 2017-05-24 DIAGNOSIS — Z0000 Encounter for general adult medical examination without abnormal findings: Secondary | ICD-10-CM

## 2017-05-24 DIAGNOSIS — D509 Iron deficiency anemia, unspecified: Secondary | ICD-10-CM

## 2017-06-01 ENCOUNTER — Other Ambulatory Visit (INDEPENDENT_AMBULATORY_CARE_PROVIDER_SITE_OTHER): Payer: BC Managed Care – PPO

## 2017-06-01 DIAGNOSIS — Z0000 Encounter for general adult medical examination without abnormal findings: Secondary | ICD-10-CM

## 2017-06-01 DIAGNOSIS — D509 Iron deficiency anemia, unspecified: Secondary | ICD-10-CM | POA: Diagnosis not present

## 2017-06-01 LAB — COMPREHENSIVE METABOLIC PANEL
ALT: 19 U/L (ref 0–53)
AST: 13 U/L (ref 0–37)
Albumin: 4.5 g/dL (ref 3.5–5.2)
Alkaline Phosphatase: 54 U/L (ref 39–117)
BUN: 15 mg/dL (ref 6–23)
CO2: 29 meq/L (ref 19–32)
CREATININE: 0.87 mg/dL (ref 0.40–1.50)
Calcium: 9.6 mg/dL (ref 8.4–10.5)
Chloride: 102 mEq/L (ref 96–112)
GFR: 98.9 mL/min (ref >60.00)
GLUCOSE: 107 mg/dL — AB (ref 70–99)
Potassium: 4.5 mEq/L (ref 3.5–5.1)
SODIUM: 138 meq/L (ref 135–145)
Total Bilirubin: 0.6 mg/dL (ref 0.2–1.2)
Total Protein: 6.9 g/dL (ref 6.0–8.3)

## 2017-06-01 LAB — CBC WITH DIFFERENTIAL/PLATELET
BASOS ABS: 0 10*3/uL (ref 0.0–0.1)
BASOS PCT: 0.5 % (ref 0.0–3.0)
Eosinophils Absolute: 0.2 10*3/uL (ref 0.0–0.7)
Eosinophils Relative: 3.1 % (ref 0.0–5.0)
HEMATOCRIT: 41.8 % (ref 39.0–52.0)
Hemoglobin: 13.5 g/dL (ref 13.0–17.0)
LYMPHS ABS: 0.9 10*3/uL (ref 0.7–4.0)
LYMPHS PCT: 13.9 % (ref 12.0–46.0)
MCHC: 32.4 g/dL (ref 30.0–36.0)
MCV: 90.2 fl (ref 78.0–100.0)
MONOS PCT: 8.8 % (ref 3.0–12.0)
Monocytes Absolute: 0.6 10*3/uL (ref 0.1–1.0)
NEUTROS ABS: 4.8 10*3/uL (ref 1.4–7.7)
NEUTROS PCT: 73.7 % (ref 43.0–77.0)
PLATELETS: 263 10*3/uL (ref 150.0–400.0)
RBC: 4.63 Mil/uL (ref 4.22–5.81)
RDW: 13.7 % (ref 11.5–15.5)
WBC: 6.5 10*3/uL (ref 4.0–10.5)

## 2017-06-01 LAB — LIPID PANEL
CHOL/HDL RATIO: 4
Cholesterol: 169 mg/dL (ref 0–200)
HDL: 38.9 mg/dL — ABNORMAL LOW (ref >39.00)
LDL Cholesterol: 107 mg/dL — ABNORMAL HIGH (ref 0–99)
NONHDL: 129.78
Triglycerides: 114 mg/dL (ref 0.0–149.0)
VLDL: 22.8 mg/dL (ref 0.0–40.0)

## 2017-06-01 LAB — PSA: PSA: 0.27 ng/mL (ref 0.10–4.00)

## 2017-06-07 ENCOUNTER — Ambulatory Visit (INDEPENDENT_AMBULATORY_CARE_PROVIDER_SITE_OTHER): Payer: BC Managed Care – PPO | Admitting: Family Medicine

## 2017-06-07 ENCOUNTER — Encounter: Payer: Self-pay | Admitting: Family Medicine

## 2017-06-07 VITALS — BP 134/90 | HR 81 | Temp 98.4°F | Ht 73.0 in | Wt 341.1 lb

## 2017-06-07 DIAGNOSIS — Z23 Encounter for immunization: Secondary | ICD-10-CM | POA: Diagnosis not present

## 2017-06-07 DIAGNOSIS — Z0000 Encounter for general adult medical examination without abnormal findings: Secondary | ICD-10-CM | POA: Diagnosis not present

## 2017-06-07 DIAGNOSIS — G4710 Hypersomnia, unspecified: Secondary | ICD-10-CM

## 2017-06-07 DIAGNOSIS — G4730 Sleep apnea, unspecified: Secondary | ICD-10-CM | POA: Diagnosis not present

## 2017-06-07 DIAGNOSIS — K900 Celiac disease: Secondary | ICD-10-CM

## 2017-06-07 DIAGNOSIS — Z6841 Body Mass Index (BMI) 40.0 and over, adult: Secondary | ICD-10-CM | POA: Diagnosis not present

## 2017-06-07 MED ORDER — FLUOCINONIDE-E 0.05 % EX CREA: 1.0000 "application " | TOPICAL_CREAM | Freq: Two times a day (BID) | CUTANEOUS | 0 refills | Status: DC

## 2017-06-07 MED ORDER — ALBUTEROL SULFATE HFA 108 (90 BASE) MCG/ACT IN AERS: INHALATION_SPRAY | RESPIRATORY_TRACT | 0 refills | Status: DC

## 2017-06-07 NOTE — Assessment & Plan Note (Signed)
Tries to follow gluten free diet. H/H normal today.

## 2017-06-07 NOTE — Patient Instructions (Signed)
Great to see you.  Let's cut back on portions and increase physical as we discussed.

## 2017-06-07 NOTE — Assessment & Plan Note (Signed)
Reviewed preventive care protocols, scheduled due services, and updated immunizations Discussed nutrition, exercise, diet, and healthy lifestyle.  

## 2017-06-07 NOTE — Assessment & Plan Note (Signed)
Deteriorated. He wants to work on diet and exercise on his own.

## 2017-06-07 NOTE — Progress Notes (Signed)
Subjective:   Patient ID: Benjamin Tapia, male    DOB: [birth year], 50 y.o.   MRN: [id number]  Benjamin Tapia is a pleasant 51 y.o. year old male who presents to clinic today with Annual Exam (Patient is here today for a CPE. Would like to get flu shot today.)  on 06/07/2017  HPI:  Overall doing well.   OSA- diagnosed by  Dr. Elsworth Soho in 2012.  Felt OSA was severe and started Bipap and increased as tolerated. Advised weight loss.  Feels so much better - wears CPAP every night.  Obesity- regained a lot of his weight back that he lost several years ago.    Denies any SOB, CP or HA.  Knows he needs to lose weight.  His daughter recently diagnosed with Type I diabetes and she has been inspiring him and his wife to eat better and exercise more.  "I know what to do."  Wt Readings from Last 3 Encounters:  06/07/17 (!) 341 lb 1.9 oz (154.7 kg)  11/04/15 (!) 338 lb 8 oz (153.5 kg)  04/10/14 (!) 308 lb 12 oz (140 kg)     Celiac- diagnosed when he saw me for fatigue, DOE in 06/2009.  Has been doing fine from a Celiac stand point.  Learned how to adjust his diet.  Does still eat gluten on occasion.    Lab Results  Component Value Date   CHOL 169 06/01/2017   HDL 38.90 (L) 06/01/2017   LDLCALC 107 (H) 06/01/2017   TRIG 114.0 06/01/2017   CHOLHDL 4 06/01/2017   Lab Results  Component Value Date   CREATININE 0.87 06/01/2017   Lab Results  Component Value Date   NA 138 06/01/2017   K 4.5 06/01/2017   CL 102 06/01/2017   CO2 29 06/01/2017   Lab Results  Component Value Date   WBC 6.5 06/01/2017   HGB 13.5 06/01/2017   HCT 41.8 06/01/2017   MCV 90.2 06/01/2017   PLT 263.0 06/01/2017   Lab Results  Component Value Date   PSA 0.27 06/01/2017   PSA 0.22 10/15/2015   PSA 0.3 04/03/2014    Current Outpatient Prescriptions on File Prior to Visit  Medication Sig Dispense Refill  . cetirizine (ZYRTEC) 10 MG tablet Take 10 mg by mouth daily as needed.      . ferrous gluconate  (FERGON) 325 MG tablet Take 325 mg by mouth 2 (two) times daily.      . fluticasone (FLONASE) 50 MCG/ACT nasal spray Place 1 spray into both nostrils daily.    . ranitidine (ZANTAC) 150 MG capsule 3 capsules by mouth once daily      No current facility-administered medications on file prior to visit.     No Known Allergies  Past Medical History:  Diagnosis Date  . ALLERGIC RHINITIS   . Asthma   . Celiac sprue     Past Surgical History:  Procedure Laterality Date  . TONSILLECTOMY      Family History  Problem Relation Age of Onset  . Hypertension Mother   . Hypertension Father   . Other Father        HLD  . Leukemia Maternal Grandfather   . Stroke Paternal Grandfather     Social History   Social History  . Marital status: Married    Spouse name: N/A  . Number of children: 2  . Years of education: N/A   Occupational History  . 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 Smoking status: Never Smoker  . Smokeless tobacco: Never Used  . Alcohol use No  . Drug use: No  . Sexual activity: Not on file   Other Topics Concern  . Not on file   Social History Narrative   Regular exercise - no   2 daughters 17 and 43 in [place]   The PMH, [place], Social History, Family History, Medications, and allergies have been reviewed in [facility], and have been updated if relevant.   Review of Systems  Constitutional: Negative.   HENT: Negative.   Respiratory: Negative.   Cardiovascular: Negative.   Gastrointestinal: Negative.   Endocrine: Negative.   Genitourinary: Negative.   Musculoskeletal: Negative.   Allergic/Immunologic: Negative.   Neurological: Negative.   Hematological: Negative.   Psychiatric/Behavioral: Negative.   All other systems reviewed and are negative.      Objective:    BP 134/90 (BP Location: Left Arm, Patient Position: Sitting, Cuff Size: Large)   Pulse 81   Temp 98.4 F (36.9 C) (Oral)   Ht 6' 1"   (1.854 m)   Wt (!) 341 lb 1.9 oz (154.7 kg)   SpO2 97%   BMI 45.01 kg/m    Physical Exam  General:  Obese, pleasant male in no acute distress Eyes:  PERRL Ears:  External ear exam shows no significant lesions or deformities.  TMs normal bilaterally Hearing is grossly normal bilaterally. Nose:  External nasal examination shows no deformity or inflammation. Nasal mucosa are pink and moist without lesions or exudates. Mouth:  Oral mucosa and oropharynx without lesions or exudates.  Teeth in good repair. Neck:  no carotid bruit or thyromegaly no cervical or supraclavicular lymphadenopathy  Lungs:  Normal respiratory effort, chest expands symmetrically. Lungs are clear to auscultation, no crackles or wheezes. Heart:  Normal rate and regular rhythm. S1 and S2 normal without gallop, murmur, click, rub or other extra sounds. Abdomen:  Bowel sounds positive,abdomen soft and non-tender without masses, organomegaly or hernias noted. Pulses:  R and L posterior tibial pulses are full and equal bilaterally  Extremities:  no edema  Psych:  Good eye contact, not anxious or depressed appearing       Assessment & Plan:   Routine general medical examination at a health care facility No Follow-up on file.

## 2017-06-07 NOTE — Assessment & Plan Note (Signed)
On CPAP. ?

## 2017-07-03 ENCOUNTER — Other Ambulatory Visit: Payer: Self-pay | Admitting: Family Medicine

## 2017-07-26 ENCOUNTER — Other Ambulatory Visit: Payer: Self-pay | Admitting: Family Medicine

## 2017-08-20 ENCOUNTER — Other Ambulatory Visit: Payer: Self-pay | Admitting: Family Medicine

## 2017-09-24 ENCOUNTER — Other Ambulatory Visit: Payer: Self-pay | Admitting: Family Medicine

## 2017-09-25 ENCOUNTER — Other Ambulatory Visit: Payer: Self-pay | Admitting: Family Medicine

## 2017-09-25 MED ORDER — ALBUTEROL SULFATE HFA 108 (90 BASE) MCG/ACT IN AERS: INHALATION_SPRAY | RESPIRATORY_TRACT | 0 refills | Status: DC

## 2017-10-22 ENCOUNTER — Other Ambulatory Visit: Payer: Self-pay | Admitting: Family Medicine

## 2017-11-19 ENCOUNTER — Other Ambulatory Visit: Payer: Self-pay | Admitting: Family Medicine

## 2018-02-05 ENCOUNTER — Other Ambulatory Visit: Payer: Self-pay | Admitting: Family Medicine

## 2018-02-05 MED ORDER — FLUOCINONIDE-E 0.05 % EX CREA: 1.0000 "application " | TOPICAL_CREAM | Freq: Two times a day (BID) | CUTANEOUS | 0 refills | Status: DC

## 2018-02-22 ENCOUNTER — Other Ambulatory Visit: Payer: Self-pay | Admitting: Family Medicine

## 2018-04-25 ENCOUNTER — Other Ambulatory Visit: Payer: Self-pay | Admitting: Family Medicine

## 2018-05-29 ENCOUNTER — Telehealth: Payer: Self-pay

## 2018-05-29 ENCOUNTER — Other Ambulatory Visit: Payer: Self-pay | Admitting: Family Medicine

## 2018-05-29 DIAGNOSIS — Z0000 Encounter for general adult medical examination without abnormal findings: Secondary | ICD-10-CM

## 2018-05-29 MED ORDER — ALBUTEROL SULFATE HFA 108 (90 BASE) MCG/ACT IN AERS: INHALATION_SPRAY | RESPIRATORY_TRACT | 1 refills | Status: DC

## 2018-05-29 NOTE — Telephone Encounter (Signed)
Copied from [place] [id number]. Topic: General - Other >> May 29, 2018  9:44 AM Leward Quan A wrote: Reason for CRM: Patient called to schedule annual physical exam. Scheduled for 06/13/18 7.15 am he would like a call back to know can he go to [facility] office to have blood work done so results can be ready when he come in for appointment. Please advise

## 2018-05-29 NOTE — Telephone Encounter (Signed)
Copied from [place] [id number]. Topic: Quick Communication - Rx Refill/Question >> May 29, 2018  9:51 AM Leward Quan A wrote: Medication: albuterol (PROVENTIL HFA;VENTOLIN HFA) 108 (90 Base) MCG/ACT inhaler   Has the patient contacted their pharmacy? No  Preferred Pharmacy (with phone number or street name): [facility], [place] [phone] (Phone) [phone] (Fax)    Agent: Please be advised that RX refills may take up to 3 business days. We ask that you follow-up with your pharmacy.

## 2018-05-29 NOTE — Telephone Encounter (Signed)
Rx sent in

## 2018-05-31 NOTE — Telephone Encounter (Signed)
Orders placed for lab work. Left VM for pt to let him know that he can go to get lab work complete at his convenience.

## 2018-05-31 NOTE — Addendum Note (Signed)
Addended by: Doy Hutching on: 05/31/2018 09:24 AM   Modules accepted: Orders

## 2018-06-07 ENCOUNTER — Other Ambulatory Visit (INDEPENDENT_AMBULATORY_CARE_PROVIDER_SITE_OTHER): Payer: BC Managed Care – PPO

## 2018-06-07 DIAGNOSIS — Z0000 Encounter for general adult medical examination without abnormal findings: Secondary | ICD-10-CM

## 2018-06-07 LAB — PSA: PSA: 0.33 ng/mL (ref 0.10–4.00)

## 2018-06-07 LAB — CBC WITH DIFFERENTIAL/PLATELET
BASOS ABS: 0 10*3/uL (ref 0.0–0.1)
Basophils Relative: 0.7 % (ref 0.0–3.0)
EOS ABS: 0.2 10*3/uL (ref 0.0–0.7)
Eosinophils Relative: 2.3 % (ref 0.0–5.0)
HCT: 43.7 % (ref 39.0–52.0)
Hemoglobin: 14.4 g/dL (ref 13.0–17.0)
LYMPHS ABS: 1.1 10*3/uL (ref 0.7–4.0)
LYMPHS PCT: 16.3 % (ref 12.0–46.0)
MCHC: 32.9 g/dL (ref 30.0–36.0)
MCV: 88.9 fl (ref 78.0–100.0)
MONOS PCT: 8.1 % (ref 3.0–12.0)
Monocytes Absolute: 0.5 10*3/uL (ref 0.1–1.0)
NEUTROS ABS: 4.7 10*3/uL (ref 1.4–7.7)
Neutrophils Relative %: 72.6 % (ref 43.0–77.0)
PLATELETS: 280 10*3/uL (ref 150.0–400.0)
RBC: 4.92 Mil/uL (ref 4.22–5.81)
RDW: 13.5 % (ref 11.5–15.5)
WBC: 6.5 10*3/uL (ref 4.0–10.5)

## 2018-06-07 LAB — COMPREHENSIVE METABOLIC PANEL
ALT: 30 U/L (ref 0–53)
AST: 18 U/L (ref 0–37)
Albumin: 4.7 g/dL (ref 3.5–5.2)
Alkaline Phosphatase: 57 U/L (ref 39–117)
BUN: 13 mg/dL (ref 6–23)
CHLORIDE: 102 meq/L (ref 96–112)
CO2: 30 meq/L (ref 19–32)
Calcium: 9.8 mg/dL (ref 8.4–10.5)
Creatinine, Ser: 0.95 mg/dL (ref 0.40–1.50)
GFR: 88.98 mL/min (ref >60.00)
Glucose, Bld: 102 mg/dL — ABNORMAL HIGH (ref 70–99)
POTASSIUM: 4.1 meq/L (ref 3.5–5.1)
SODIUM: 140 meq/L (ref 135–145)
Total Bilirubin: 0.6 mg/dL (ref 0.2–1.2)
Total Protein: 7.4 g/dL (ref 6.0–8.3)

## 2018-06-07 LAB — LIPID PANEL
CHOL/HDL RATIO: 4
Cholesterol: 176 mg/dL (ref 0–200)
HDL: 39.6 mg/dL (ref >39.00)
LDL CALC: 116 mg/dL — AB (ref 0–99)
NONHDL: 136.13
Triglycerides: 100 mg/dL (ref 0.0–149.0)
VLDL: 20 mg/dL (ref 0.0–40.0)

## 2018-06-13 ENCOUNTER — Ambulatory Visit (INDEPENDENT_AMBULATORY_CARE_PROVIDER_SITE_OTHER): Payer: BC Managed Care – PPO | Admitting: Family Medicine

## 2018-06-13 ENCOUNTER — Encounter: Payer: Self-pay | Admitting: Family Medicine

## 2018-06-13 VITALS — BP 138/94 | HR 81 | Temp 99.1°F | Ht 73.25 in | Wt 344.8 lb

## 2018-06-13 DIAGNOSIS — Q828 Other specified congenital malformations of skin: Secondary | ICD-10-CM | POA: Insufficient documentation

## 2018-06-13 DIAGNOSIS — Z0000 Encounter for general adult medical examination without abnormal findings: Secondary | ICD-10-CM | POA: Diagnosis not present

## 2018-06-13 DIAGNOSIS — Z6841 Body Mass Index (BMI) 40.0 and over, adult: Secondary | ICD-10-CM | POA: Diagnosis not present

## 2018-06-13 DIAGNOSIS — Z23 Encounter for immunization: Secondary | ICD-10-CM

## 2018-06-13 DIAGNOSIS — G4730 Sleep apnea, unspecified: Secondary | ICD-10-CM

## 2018-06-13 DIAGNOSIS — G4710 Hypersomnia, unspecified: Secondary | ICD-10-CM

## 2018-06-13 NOTE — Assessment & Plan Note (Signed)
Deteriorated. Discussed obesity- given eat right diet, referral to medical weight management. He is motivated to make a change.

## 2018-06-13 NOTE — Assessment & Plan Note (Signed)
Reviewed preventive care protocols, scheduled due services, and updated immunizations Discussed nutrition, exercise, diet, and healthy lifestyle.  

## 2018-06-13 NOTE — Assessment & Plan Note (Signed)
Compliant with CPAP 

## 2018-06-13 NOTE — Progress Notes (Signed)
Subjective:   Patient ID: Benjamin Tapia, male    DOB: 1966/07/13, 52 y.o.   MRN: [id number]  Benjamin Tapia is a pleasant 52 y.o. year old male who presents to clinic today with Annual Exam (Patient is here today for a CPE.  Labs completed on 10.11.19.  He agrees to get his Flu shot and wants to update his Tdap today.   He agrees to [employer]. He declines HIV lab draw.)  on 06/13/2018  HPI:  Health Maintenance  Topic Date Due  . COLONOSCOPY  11/06/2017  . INFLUENZA VACCINE  03/28/2018  . TETANUS/TDAP  07/10/2019  . HIV Screening  Discontinued     OSA- diagnosed by  Dr. Elsworth Soho in 2012.  Felt OSA was severe and started Bipap and increased as tolerated. Advised weight loss. He is compliant with CPAP.  Obesity- regained a lot of his weight back that he lost several years ago.    Denies any SOB, CP or HA.  Knows he needs to lose weight.  His daughter recently diagnosed with Type I diabetes and she has been inspiring him and his wife to eat better and exercise more.  "I know what to do."  Wt Readings from Last 3 Encounters:  06/13/18 (!) 344 lb 12.8 oz (156.4 kg)  06/07/17 (!) 341 lb 1.9 oz (154.7 kg)  11/04/15 (!) 338 lb 8 oz (153.5 kg)     Celiac- diagnosed when he saw me for fatigue, DOE in 06/2009. Found to be severely anemic due to celiac.  Lab Results  Component Value Date   WBC 6.5 06/07/2018   HGB 14.4 06/07/2018   HCT 43.7 06/07/2018   MCV 88.9 06/07/2018   PLT 280.0 06/07/2018     Has been doing fine from a Celiac stand point.  Learned how to adjust his diet.  Does still eat gluten on occasion.    Lab Results  Component Value Date   CHOL 176 06/07/2018   HDL 39.60 06/07/2018   LDLCALC 116 (H) 06/07/2018   TRIG 100.0 06/07/2018   CHOLHDL 4 06/07/2018   Lab Results  Component Value Date   CREATININE 0.95 06/07/2018   Lab Results  Component Value Date   NA 140 06/07/2018   K 4.1 06/07/2018   CL 102 06/07/2018   CO2 30 06/07/2018   Lab Results    Component Value Date   WBC 6.5 06/07/2018   HGB 14.4 06/07/2018   HCT 43.7 06/07/2018   MCV 88.9 06/07/2018   PLT 280.0 06/07/2018   Lab Results  Component Value Date   PSA 0.33 06/07/2018   PSA 0.27 06/01/2017   PSA 0.22 10/15/2015    Current Outpatient Medications on File Prior to Visit  Medication Sig Dispense Refill  . albuterol (PROVENTIL HFA;VENTOLIN HFA) 108 (90 Base) MCG/ACT inhaler INHALE TWO PUFFS BY MOUTH EVERY 4 TO 6 HOURS 8.5 each 1  . cetirizine (ZYRTEC) 10 MG tablet Take 10 mg by mouth daily as needed.      . ferrous gluconate (FERGON) 325 MG tablet Take 325 mg by mouth 2 (two) times daily.      . fluocinonide-emollient (LIDEX-E) 0.05 % cream Apply 1 application topically 2 (two) times daily. 30 g 0  . fluticasone (FLONASE) 50 MCG/ACT nasal spray Place 1 spray into both nostrils daily.    . ranitidine (ZANTAC) 150 MG capsule 3 capsules by mouth once daily      No current facility-administered medications on file prior to visit.  No Known Allergies  Past Medical History:  Diagnosis Date  . ALLERGIC RHINITIS   . Asthma   . Celiac sprue       Family History  Problem Relation Age of Onset  . Hypertension Mother   . Hypertension Father   . Other Father        HLD  . Leukemia Maternal Grandfather   . Stroke Paternal Grandfather     Social History   Socioeconomic History  . Marital status: Married    Spouse name: Not on file  . Number of children: 2  . Years of education: Not on file  . Highest education level: Not on file  Occupational History  . 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 Financial resource strain: Not on file  . Food insecurity:    Worry: Not on file    Inability: Not on file  . Transportation needs:    Medical: Not on file    Non-medical: Not on file  Tobacco Use  . Smoking status: Never Smoker  . Smokeless tobacco: Never Used  Substance and Sexual Activity  . Alcohol use:  No  . Drug use: No  . Sexual activity: Not on file  Lifestyle  . Physical activity:    Days per week: Not on file    Minutes per session: Not on file  . Stress: Not on file  Relationships  . Social connections:    Talks on phone: Not on file    Gets together: Not on file    Attends religious service: Not on file    Active member of club or organization: Not on file    Attends meetings of clubs or organizations: Not on file    Relationship status: Not on file  . Intimate partner violence:    Fear of current or ex partner: Not on file    Emotionally abused: Not on file    Physically abused: Not on file    Forced sexual activity: Not on file  Other Topics Concern  . Not on file  Social History Narrative   Regular exercise - no   2 daughters 109 and 89 in [place]   The PMH, [place], Social History, Family History, Medications, and allergies have been reviewed in [facility], and have been updated if relevant.   Review of Systems  Constitutional: Negative.   HENT: Negative.   Respiratory: Negative.   Cardiovascular: Negative.   Gastrointestinal: Negative.   Endocrine: Negative.   Genitourinary: Negative.   Musculoskeletal: Negative.   Allergic/Immunologic: Negative.   Neurological: Negative.   Hematological: Negative.   Psychiatric/Behavioral: Negative.   All other systems reviewed and are negative.      Objective:    BP (!) 138/94 (BP Location: Left Arm, Patient Position: Sitting, Cuff Size: Large)   Pulse 81   Temp 99.1 F (37.3 C) (Oral)   Ht 6' 1.25" (1.861 m)   Wt (!) 344 lb 12.8 oz (156.4 kg)   SpO2 98%   BMI 45.18 kg/m    Physical Exam  General:  pleasant male in no acute distress Eyes:  PERRL Ears:  External ear exam shows no significant lesions or deformities.  TMs normal bilaterally Hearing is grossly normal bilaterally. Nose:  External nasal examination shows no deformity or inflammation. Nasal mucosa are pink and moist without lesions or  exudates. Mouth:  Oral mucosa and oropharynx without lesions or exudates.  Teeth in good repair. Neck:  no  carotid bruit or thyromegaly no cervical or supraclavicular lymphadenopathy  Lungs:  Normal respiratory effort, chest expands symmetrically. Lungs are clear to auscultation, no crackles or wheezes. Heart:  Normal rate and regular rhythm. S1 and S2 normal without gallop, murmur, click, rub or other extra sounds. Prostate:  Prostate gland firm and smooth, 1 plus enlargement, no nodularity, tenderness, mass, asymmetry or induration. Pulses:  R and L posterior tibial pulses are full and equal bilaterally  Extremities:  no edema  Psych:  Good eye contact, not anxious or depressed appearing      Assessment & Plan:   Routine general medical examination at a health care facility  Need for influenza vaccination - Plan: Flu Vaccine QUAD 6+ mos PF IM (Fluarix Quad PF)  Need for Tdap vaccination - Plan: Tdap vaccine greater than or equal to 7yo IM No follow-ups on file.

## 2018-06-13 NOTE — Patient Instructions (Signed)
Great to see you. Say hi to the family for me.

## 2018-07-23 ENCOUNTER — Other Ambulatory Visit: Payer: Self-pay | Admitting: Family Medicine

## 2018-07-24 ENCOUNTER — Other Ambulatory Visit: Payer: Self-pay

## 2018-07-24 DIAGNOSIS — J4540 Moderate persistent asthma, uncomplicated: Secondary | ICD-10-CM

## 2018-07-24 MED ORDER — ALBUTEROL SULFATE HFA 108 (90 BASE) MCG/ACT IN AERS: INHALATION_SPRAY | RESPIRATORY_TRACT | 1 refills | Status: DC

## 2018-07-24 NOTE — Progress Notes (Signed)
[facility] called pt needed a refill on his albuterol inhaler .Pt going out of town.  Pt was last seen 06/13/2018 for routine exam. Sent rx in.

## 2018-09-19 ENCOUNTER — Other Ambulatory Visit: Payer: Self-pay | Admitting: Family Medicine

## 2018-09-19 DIAGNOSIS — J4540 Moderate persistent asthma, uncomplicated: Secondary | ICD-10-CM

## 2018-10-11 ENCOUNTER — Telehealth: Payer: Self-pay

## 2018-10-11 MED ORDER — FLUOCINONIDE-E 0.05 % EX CREA: 1.0000 "application " | TOPICAL_CREAM | Freq: Two times a day (BID) | CUTANEOUS | 0 refills | Status: DC

## 2018-10-11 NOTE — Telephone Encounter (Signed)
Refill sent to pharmacy on file as requested.  Thank you.

## 2018-10-11 NOTE — Telephone Encounter (Signed)
Pt would like a refill on the fluocinonide 0.05% cream , last refill was 02/05/2018. Is it ok to fill?

## 2018-11-22 ENCOUNTER — Other Ambulatory Visit: Payer: Self-pay | Admitting: Family Medicine

## 2018-11-22 DIAGNOSIS — J4540 Moderate persistent asthma, uncomplicated: Secondary | ICD-10-CM

## 2018-12-25 ENCOUNTER — Other Ambulatory Visit: Payer: Self-pay | Admitting: Family Medicine

## 2018-12-25 DIAGNOSIS — J4540 Moderate persistent asthma, uncomplicated: Secondary | ICD-10-CM

## 2019-01-24 ENCOUNTER — Other Ambulatory Visit: Payer: Self-pay | Admitting: Family Medicine

## 2019-01-24 DIAGNOSIS — J4540 Moderate persistent asthma, uncomplicated: Secondary | ICD-10-CM

## 2019-10-09 ENCOUNTER — Other Ambulatory Visit: Payer: Self-pay | Admitting: Family Medicine

## 2019-10-09 DIAGNOSIS — J4540 Moderate persistent asthma, uncomplicated: Secondary | ICD-10-CM

## 2019-10-09 MED ORDER — ALBUTEROL SULFATE HFA 108 (90 BASE) MCG/ACT IN AERS: INHALATION_SPRAY | RESPIRATORY_TRACT | 11 refills | Status: DC

## 2020-03-25 ENCOUNTER — Ambulatory Visit (INDEPENDENT_AMBULATORY_CARE_PROVIDER_SITE_OTHER): Payer: BC Managed Care – PPO | Admitting: Pulmonary Disease

## 2020-03-25 ENCOUNTER — Encounter: Payer: Self-pay | Admitting: Pulmonary Disease

## 2020-03-25 ENCOUNTER — Other Ambulatory Visit: Payer: Self-pay

## 2020-03-25 VITALS — BP 160/90 | HR 100 | Temp 98.3°F | Ht 73.5 in | Wt 348.2 lb

## 2020-03-25 DIAGNOSIS — G4733 Obstructive sleep apnea (adult) (pediatric): Secondary | ICD-10-CM | POA: Diagnosis not present

## 2020-03-25 NOTE — Progress Notes (Signed)
Benjamin Tapia    [id number]    11/08/1965  Primary Care Physician:Patient, No Pcp Per  Referring Physician: Lucille Passy, MD No address on file  Chief complaint:   Patient in for evaluation of obstructive sleep apnea  HPI:  Diagnosed with obstructive sleep apnea in 2012 Has been using his machine since about 2014  Showing message on his machine stating that the motor is beyond its working [employer] still works well He sleeps well Denies any significant symptoms Wakes up feeling like he is at a good nights rest  Usually goes to bed between 11 and 7 6 AM Takes him about 2 to 5 minutes to fall asleep Does not wake up during the night Final awakening time about 6 AM  Denies any significant daytime symptoms No dryness of his mouth, no morning headaches  He feels he may not be able to get sleep without being on his BiPAP  Previous study did show severe obstructive sleep apnea titrated to 24/20  He does have asthma that is well controlled  Outpatient Encounter Medications as of 03/25/2020  Medication Sig  . albuterol (VENTOLIN HFA) 108 (90 Base) MCG/ACT inhaler INHALE TWO PUFFS BY MOUTH EVERY 4 TO 6 HOURS AS NEEDED  . cetirizine (ZYRTEC) 10 MG tablet Take 10 mg by mouth daily as needed.    . ferrous gluconate (FERGON) 325 MG tablet Take 325 mg by mouth 2 (two) times daily.    . fluocinonide-emollient (LIDEX-E) 0.05 % cream Apply 1 application topically 2 (two) times daily.  . fluticasone (FLONASE) 50 MCG/ACT nasal spray Place 1 spray into both nostrils daily.  . ranitidine (ZANTAC) 150 MG capsule 3 capsules by mouth once daily  (Patient not taking: Reported on 03/25/2020)   No facility-administered encounter medications on file as of 03/25/2020.    Allergies as of 03/25/2020  . (No Known Allergies)    Past Medical History:  Diagnosis Date  . ALLERGIC RHINITIS   . Asthma   . Celiac sprue   . Sleep apnea     Past Surgical History:  Procedure  Laterality Date  . TONSILLECTOMY      Family History  Problem Relation Age of Onset  . Hypertension Mother   . Hypertension Father   . Other Father        HLD  . Leukemia Maternal Grandfather   . Stroke Paternal Grandfather     Social History   Socioeconomic History  . Marital status: Married    Spouse name: Not on file  . Number of children: 2  . Years of education: Not on file  . Highest education level: Not on file  Occupational History  . 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 Smoking status: Never Smoker  . Smokeless tobacco: Never Used  Substance and Sexual Activity  . Alcohol use: No  . Drug use: No  . Sexual activity: Not on file  Other Topics Concern  . Not on file  Social History Narrative   Regular exercise - no   2 daughters 11 and 40 in [place] Determinants of Health   Financial Resource Strain:   . Difficulty of Paying Living Expenses:   Food Insecurity:   . Worried About Charity fundraiser in the Last Year:   . [place] in the Last Year:   Transportation Needs:   . Lack of  Transportation (Medical):   Marland Kitchen Lack of Transportation (Non-Medical):   Physical Activity:   . Days of Exercise per Week:   . Minutes of Exercise per Session:   Stress:   . Feeling of Stress :   Social Connections:   . Frequency of Communication with Friends and Family:   . Frequency of Social Gatherings with Friends and Family:   . Attends Religious Services:   . Active Member of Clubs or Organizations:   . Attends Archivist Meetings:   Marland Kitchen Marital Status:   Intimate Partner Violence:   . Fear of Current or Ex-Partner:   . Emotionally Abused:   Marland Kitchen Physically Abused:   . Sexually Abused:     Review of Systems  Constitutional: Positive for fatigue.  HENT: Negative.   Respiratory: Positive for apnea.   Cardiovascular: Negative.   Gastrointestinal: Negative.   Psychiatric/Behavioral: Positive  for sleep disturbance.  All other systems reviewed and are negative.   Vitals:   03/25/20 1007  BP: (!) 160/90  Pulse: 100  Temp: 98.3 F (36.8 C)  SpO2: 97%   Physical Exam Constitutional:      Appearance: He is obese.  HENT:     Head: Normocephalic.     Nose: Nose normal.     Mouth/Throat:     Mouth: Mucous membranes are moist.  Eyes:     General:        Right eye: No discharge.        Left eye: No discharge.     Pupils: Pupils are equal, round, and reactive to light.  Cardiovascular:     Rate and Rhythm: Normal rate and regular rhythm.     Pulses: Normal pulses.     Heart sounds: Normal heart sounds. No murmur heard.  No friction rub.  Pulmonary:     Effort: Pulmonary effort is normal. No respiratory distress.     Breath sounds: Normal breath sounds. No stridor. No wheezing or rhonchi.  Musculoskeletal:        General: Normal range of motion.     Cervical back: No rigidity or tenderness.  Skin:    General: Skin is warm.  Neurological:     General: No focal deficit present.     Mental Status: He is alert.  Psychiatric:        Mood and Affect: Mood normal.    No flowsheet data found.  Epworth score of 0 today 03/25/2020  Data Reviewed: Previous polysomnogram from 2012 reviewed showing severe obstructive sleep apnea, he was titrated to BiPAP 24/20-study was reviewed   Assessment:  Severe obstructive sleep apnea well managed with BiPAP therapy  Morbid obesity  Asthma-well-controlled  Pathophysiology of sleep disordered breathing discussed  Plan/Recommendations: We will schedule the patient for home sleep study -He is concerned he may not be able to sleep without his BiPAP on -We will try and get the study done on a day when he does not have to work the next day in case he gets very poor sleep  We will follow-up in 6 months  Encouraged to continue using his current BiPAP   Sherrilyn Rist MD [facility] 03/25/2020, 10:23  AM  CC: Lucille Passy, MD

## 2020-03-25 NOTE — Patient Instructions (Signed)
History of obstructive sleep apnea  Obtain a home sleep study to confirm presence of obstructive sleep apnea Once study is reviewed, we will send a prescription to the medical supply company for a new machine  Prescription will be for the same pressure settings you are on at present   I will see you in about 6 months for follow-up Above process will be done by then-usually takes 3 to 4 weeks at most

## 2020-04-23 ENCOUNTER — Other Ambulatory Visit: Payer: Self-pay

## 2020-04-23 DIAGNOSIS — G4733 Obstructive sleep apnea (adult) (pediatric): Secondary | ICD-10-CM | POA: Diagnosis not present

## 2020-05-06 ENCOUNTER — Telehealth: Payer: Self-pay | Admitting: Pulmonary Disease

## 2020-05-06 DIAGNOSIS — G4733 Obstructive sleep apnea (adult) (pediatric): Secondary | ICD-10-CM | POA: Diagnosis not present

## 2020-05-06 NOTE — Telephone Encounter (Signed)
Called and left message for patient to return call.  

## 2020-05-06 NOTE — Telephone Encounter (Signed)
Call patient  Sleep study result  Date of study: 04/24/2020  Impression: Severe obstructive sleep apnea Moderate oxygen desaturations  Recommendation: Recommend CPAP therapy for severe obstructive sleep apnea Auto titrating CPAP with pressure settings of 5-20 will be appropriate  Follow-up as previously scheduled

## 2020-05-07 NOTE — Telephone Encounter (Signed)
Pt returning a phone call about hst results. Pt can be reached at [phone].

## 2020-05-07 NOTE — Telephone Encounter (Signed)
Attempted to call pt but unable to reach. Left message for him to return call. °

## 2020-05-07 NOTE — Telephone Encounter (Signed)
Pt is returning call regarding sleep results - Before 5:00 today need to call work phone [phone] - after 5:00 - [phone]

## 2020-05-07 NOTE — Telephone Encounter (Signed)
Patient contacted with results of home sleep study. Patient is currently on Bipap therapy but agrees to start CPAP per Dr. Judson Roch recommendations. DME order placed to [facility], follow up recall in.

## 2020-05-17 NOTE — Telephone Encounter (Signed)
mychart message sent by pt:  To: [facility]    From: Isidoro Santillana    Created: 05/17/2020 3:40 PM     *-*-*This message was handled on 05/17/2020 4:46 PM by Lilliahna Schubring P*-*-*  Good Afternoon,  I wanted to follow up to make sure the prescription has been sent to my local home healthcare company.  I have not heard from them since your office advised me I still had severe sleep apnea (been over a week).    thank you,  Kindrick Lankford     Order was placed 05/07/20. [facility], please advise.

## 2020-05-18 NOTE — Telephone Encounter (Signed)
I sent order to [facility] on 9/10 and confirmation received from [facility] on 9/13.  I will send message to him to check status of order.

## 2020-05-18 NOTE — Telephone Encounter (Signed)
Message received from [place] at [facility] -      Patients order is currently in the scheduling process awaiting contact to schedule time for set up. I messaged the scheduling team to contact patient.     -will route message back to triage for them to make pt aware thru [place].

## 2020-05-19 NOTE — Telephone Encounter (Addendum)
I called Brad at [facility] to check status.  He wants to get clarification from notes he's looking at and will get back to me.

## 2020-05-19 NOTE — Telephone Encounter (Signed)
Brad left me a vm.  He checked on why pt not able to get a CPAP machine at this time and it is due to shortage of machines.  Pt got a letter stating he had his machine for 5 years so he is due a new one.  That is why he requested a new machine.  They are having to provide machines now for pt's that are new starts and pt's that have a machine that has broken.  Will route message back to triage so they can make pt aware thru MyChart and pt had requested an order that he could send to someone else.

## 2020-05-19 NOTE — Telephone Encounter (Signed)
PCC's please see email and send order to a different DME for CPAP per pt request, thank you!  Thanks for checking with them, it differs from the explanation they provided.  They advised they would not honor prescription and since my machine was still working I could wait so other people could get a new machine.  Makes no sense, I wonder if they wait until their car stops working before they purchase a newer car and of course my machine could stop working at anytime.   Anyway, yes you can try another DME company as long as it is not too far from [place], [place].  Hopefully you will be able to locate a company that can assist.  If you are not able to locate a company that can provide a machine for purchase then I would try on my own.  thank you,  walt Almon

## 2020-05-19 NOTE — Telephone Encounter (Signed)
PCC's please see pt email regarding her CPAP order and help with this,thanks!  Thank you, I received a call from the [facility] yesterday and returned the call today.  They advised me that they would not accept/honor Dr. Monica Becton prescription.  This is unacceptable, I need to get a new machine ASAP and not getting one is not reasonable at this time.  I would like to request the prescription so I can go locate and purchase a CPAP machine on my own since the [facility] will not provide me with one to purchase.  Delia Heady

## 2020-10-13 ENCOUNTER — Other Ambulatory Visit: Payer: Self-pay

## 2020-10-14 ENCOUNTER — Encounter: Payer: Self-pay | Admitting: Nurse Practitioner

## 2020-10-14 ENCOUNTER — Ambulatory Visit: Payer: BC Managed Care – PPO | Admitting: Nurse Practitioner

## 2020-10-14 VITALS — BP 165/120 | HR 83 | Temp 98.5°F | Ht 73.5 in | Wt 351.4 lb

## 2020-10-14 DIAGNOSIS — I10 Essential (primary) hypertension: Secondary | ICD-10-CM | POA: Diagnosis not present

## 2020-10-14 DIAGNOSIS — J4540 Moderate persistent asthma, uncomplicated: Secondary | ICD-10-CM | POA: Diagnosis not present

## 2020-10-14 DIAGNOSIS — Z23 Encounter for immunization: Secondary | ICD-10-CM | POA: Diagnosis not present

## 2020-10-14 DIAGNOSIS — L409 Psoriasis, unspecified: Secondary | ICD-10-CM

## 2020-10-14 LAB — COMPREHENSIVE METABOLIC PANEL
ALT: 28 U/L (ref 0–53)
AST: 21 U/L (ref 0–37)
Albumin: 4.4 g/dL (ref 3.5–5.2)
Alkaline Phosphatase: 65 U/L (ref 39–117)
BUN: 14 mg/dL (ref 6–23)
CO2: 29 mEq/L (ref 19–32)
Calcium: 9.2 mg/dL (ref 8.4–10.5)
Chloride: 103 mEq/L (ref 96–112)
Creatinine, Ser: 0.95 mg/dL (ref 0.40–1.50)
GFR: 91.75 mL/min (ref >60.00)
Glucose, Bld: 106 mg/dL — ABNORMAL HIGH (ref 70–99)
Potassium: 4.1 mEq/L (ref 3.5–5.1)
Sodium: 139 mEq/L (ref 135–145)
Total Bilirubin: 0.5 mg/dL (ref 0.2–1.2)
Total Protein: 7 g/dL (ref 6.0–8.3)

## 2020-10-14 LAB — TSH: TSH: 1.95 u[IU]/mL (ref 0.35–4.50)

## 2020-10-14 LAB — LIPID PANEL
Cholesterol: 185 mg/dL (ref 0–200)
HDL: 43.4 mg/dL (ref >39.00)
LDL Cholesterol: 118 mg/dL — ABNORMAL HIGH (ref 0–99)
NonHDL: 142
Total CHOL/HDL Ratio: 4
Triglycerides: 121 mg/dL (ref 0.0–149.0)
VLDL: 24.2 mg/dL (ref 0.0–40.0)

## 2020-10-14 LAB — CBC
HCT: 40.5 % (ref 39.0–52.0)
Hemoglobin: 13.3 g/dL (ref 13.0–17.0)
MCHC: 32.7 g/dL (ref 30.0–36.0)
MCV: 88 fl (ref 78.0–100.0)
Platelets: 331 10*3/uL (ref 150.0–400.0)
RBC: 4.6 Mil/uL (ref 4.22–5.81)
RDW: 13.3 % (ref 11.5–15.5)
WBC: 7.4 10*3/uL (ref 4.0–10.5)

## 2020-10-14 MED ORDER — FLUOCINONIDE EMULSIFIED BASE 0.05 % EX CREA: 1.0000 "application " | TOPICAL_CREAM | Freq: Two times a day (BID) | CUTANEOUS | 0 refills | Status: DC

## 2020-10-14 MED ORDER — BUDESONIDE-FORMOTEROL FUMARATE 160-4.5 MCG/ACT IN AERO: 1.0000 | INHALATION_SPRAY | Freq: Two times a day (BID) | RESPIRATORY_TRACT | 5 refills | Status: DC

## 2020-10-14 MED ORDER — ALBUTEROL SULFATE HFA 108 (90 BASE) MCG/ACT IN AERS: 1.0000 | INHALATION_SPRAY | Freq: Four times a day (QID) | RESPIRATORY_TRACT | 0 refills | Status: DC | PRN

## 2020-10-14 MED ORDER — AMLODIPINE BESYLATE-VALSARTAN 5-160 MG PO TABS: 1.0000 | ORAL_TABLET | Freq: Every day | ORAL | 5 refills | Status: DC

## 2020-10-14 NOTE — Patient Instructions (Signed)
Start DASh diet and walking x 3mns  Go to lab for blood draw   [email or web address]">  DASH Eating Plan DASH stands for Dietary Approaches to Stop Hypertension. The DASH eating plan is a healthy eating plan that has been shown to:  Reduce high blood pressure (hypertension).  Reduce your risk for type 2 diabetes, heart disease, and stroke.  Help with weight loss. What are tips for following this plan? Reading food labels  Check food labels for the amount of salt (sodium) per serving. Choose foods with less than 5 percent of the Daily Value of sodium. Generally, foods with less than 300 milligrams (mg) of sodium per serving fit into this eating plan.  To find whole grains, look for the word "whole" as the first word in the ingredient list. Shopping  Buy products labeled as "low-sodium" or "no salt added."  Buy fresh foods. Avoid canned foods and pre-made or frozen meals. Cooking  Avoid adding salt when cooking. Use salt-free seasonings or herbs instead of table salt or sea salt. Check with your health care provider or pharmacist before using salt substitutes.  Do not fry foods. Cook foods using healthy methods such as baking, boiling, grilling, roasting, and broiling instead.  Cook with heart-healthy oils, such as olive, canola, avocado, soybean, or sunflower oil. Meal planning  Eat a balanced diet that includes: ? 4 or more servings of fruits and 4 or more servings of vegetables each day. Try to fill one-half of your plate with fruits and vegetables. ? 6-8 servings of whole grains each day. ? Less than 6 oz (170 g) of lean meat, poultry, or fish each day. A 3-oz (85-g) serving of meat is about the same size as a deck of cards. One egg equals 1 oz (28 g). ? 2-3 servings of low-fat dairy each day. One serving is 1 cup (237 mL). ? 1 serving of nuts, seeds, or beans 5 times each week. ? 2-3 servings of heart-healthy fats. Healthy fats  called omega-3 fatty acids are found in foods such as walnuts, flaxseeds, fortified milks, and eggs. These fats are also found in cold-water fish, such as sardines, salmon, and mackerel.  Limit how much you eat of: ? Canned or prepackaged foods. ? Food that is high in trans fat, such as some fried foods. ? Food that is high in saturated fat, such as fatty meat. ? Desserts and other sweets, sugary drinks, and other foods with added sugar. ? Full-fat dairy products.  Do not salt foods before eating.  Do not eat more than 4 egg yolks a week.  Try to eat at least 2 vegetarian meals a week.  Eat more home-cooked food and less restaurant, buffet, and fast food.   Lifestyle  When eating at a restaurant, ask that your food be prepared with less salt or no salt, if possible.  If you drink alcohol: ? Limit how much you use to:  0-1 drink a day for women who are not pregnant.  0-2 drinks a day for men. ? Be aware of how much alcohol is in your drink. In the [place] one drink equals one 12 oz bottle of beer (355 mL), one 5 oz glass of wine (148 mL), or one 1 oz glass of hard liquor (44 mL). General information  Avoid eating more than 2,300 mg of salt a day. If you have hypertension, you may need to reduce your sodium intake to 1,500 mg a day.  Work with your health care provider to  maintain a healthy body weight or to lose weight. Ask what an ideal weight is for you.  Get at least 30 minutes of exercise that causes your heart to beat faster (aerobic exercise) most days of the week. Activities may include walking, swimming, or biking.  Work with your health care provider or dietitian to adjust your eating plan to your individual calorie needs. What foods should I eat? Fruits All fresh, dried, or frozen fruit. Canned fruit in natural juice (without added sugar). Vegetables Fresh or frozen vegetables (raw, steamed, roasted, or grilled). Low-sodium or reduced-sodium tomato and vegetable juice.  Low-sodium or reduced-sodium tomato sauce and tomato paste. Low-sodium or reduced-sodium canned vegetables. Grains Whole-grain or whole-wheat bread. Whole-grain or whole-wheat pasta. Brown rice. [place]. Bulgur. Whole-grain and low-sodium cereals. Pita bread. Low-fat, low-sodium crackers. Whole-wheat flour tortillas. Meats and other proteins Skinless chicken or [place]. Ground chicken or [place]. Pork with fat trimmed off. Fish and seafood. Egg whites. Dried beans, peas, or lentils. Unsalted nuts, nut butters, and seeds. Unsalted canned beans. Lean cuts of beef with fat trimmed off. Low-sodium, lean precooked or cured meat, such as sausages or meat loaves. Dairy Low-fat (1%) or fat-free (skim) milk. Reduced-fat, low-fat, or fat-free cheeses. Nonfat, low-sodium ricotta or cottage cheese. Low-fat or nonfat yogurt. Low-fat, low-sodium cheese. Fats and oils Soft margarine without trans fats. Vegetable oil. Reduced-fat, low-fat, or light mayonnaise and salad dressings (reduced-sodium). Canola, safflower, olive, avocado, soybean, and sunflower oils. Avocado. Seasonings and condiments Herbs. Spices. Seasoning mixes without salt. Other foods Unsalted popcorn and pretzels. Fat-free sweets. The items listed above may not be a complete list of foods and beverages you can eat. Contact a dietitian for more information. What foods should I avoid? Fruits Canned fruit in a light or heavy syrup. Fried fruit. Fruit in cream or butter sauce. Vegetables Creamed or fried vegetables. Vegetables in a cheese sauce. Regular canned vegetables (not low-sodium or reduced-sodium). Regular canned tomato sauce and paste (not low-sodium or reduced-sodium). Regular tomato and vegetable juice (not low-sodium or reduced-sodium). Angie Fava. Olives. Grains Baked goods made with fat, such as croissants, muffins, or some breads. Dry pasta or rice meal packs. Meats and other proteins Fatty cuts of meat. Ribs. Fried meat. Berniece Salines.  Bologna, salami, and other precooked or cured meats, such as sausages or meat loaves. Fat from the back of a pig (fatback). Bratwurst. Salted nuts and seeds. Canned beans with added salt. Canned or smoked fish. Whole eggs or egg yolks. Chicken or [place] with skin. Dairy Whole or 2% milk, cream, and half-and-half. Whole or full-fat cream cheese. Whole-fat or sweetened yogurt. Full-fat cheese. Nondairy creamers. Whipped toppings. Processed cheese and cheese spreads. Fats and oils Butter. Stick margarine. Lard. Shortening. Ghee. Bacon fat. Tropical oils, such as coconut, palm kernel, or palm oil. Seasonings and condiments Onion salt, garlic salt, seasoned salt, table salt, and sea salt. Worcestershire sauce. Tartar sauce. Barbecue sauce. Teriyaki sauce. Soy sauce, including reduced-sodium. Steak sauce. Canned and packaged gravies. Fish sauce. Oyster sauce. Cocktail sauce. Store-bought horseradish. Ketchup. Mustard. Meat flavorings and tenderizers. Bouillon cubes. Hot sauces. Pre-made or packaged marinades. Pre-made or packaged taco seasonings. Relishes. Regular salad dressings. Other foods Salted popcorn and pretzels. The items listed above may not be a complete list of foods and beverages you should avoid. Contact a dietitian for more information. Where to find more information  National Heart, Lung, and Blood Institute: https://wilson-eaton.com/  American Heart Association: www.heart.org  Academy of Nutrition and Dietetics: www.eatright.[place]: www.kidney.org  Summary  The DASH eating plan is a healthy eating plan that has been shown to reduce high blood pressure (hypertension). It may also reduce your risk for type 2 diabetes, heart disease, and stroke.  When on the DASH eating plan, aim to eat more fresh fruits and vegetables, whole grains, lean proteins, low-fat dairy, and heart-healthy fats.  With the DASH eating plan, you should limit salt (sodium) intake to 2,300 mg a  day. If you have hypertension, you may need to reduce your sodium intake to 1,500 mg a day.  Work with your health care provider or dietitian to adjust your eating plan to your individual calorie needs. This information is not intended to replace advice given to you by your health care provider. Make sure you discuss any questions you have with your health care provider. Document Revised: 07/18/2019 Document Reviewed: 07/18/2019 Elsevier Patient Education  2021 [employer].

## 2020-10-16 DIAGNOSIS — I10 Essential (primary) hypertension: Secondary | ICD-10-CM | POA: Insufficient documentation

## 2020-10-16 NOTE — Assessment & Plan Note (Addendum)
Chronic elevated BP without use of medication Still elevated after repeat check 64mns later Asymptomatic BP Readings from Last 3 Encounters:  10/14/20 (!) 165/120  03/25/20 (!) 160/90  06/13/18 (!) 138/94   Check TSH, BMP and CBC Start amlodipine and valsartan Advised about need for DASH diet and exercise F/up in 155month

## 2020-10-16 NOTE — Progress Notes (Signed)
Subjective:  Patient ID: Benjamin Tapia, male    DOB: Sep 09, 1965  Age: 55 y.o. MRN: [id number]  CC: Establish Care (Medication refills and flu shot.  )  Transfer from Dr. Deborra Tapia.  Asthma He complains of chest tightness, cough and wheezing. This is a chronic problem. The current episode started more than 1 year ago. The problem occurs daily. The problem has been unchanged. The cough is non-productive. Pertinent negatives include no dyspnea on exertion, fever, orthopnea, PND, sweats or weight loss. His symptoms are aggravated by change in weather. His symptoms are alleviated by beta-agonist. He reports moderate improvement on treatment. There are no known risk factors for lung disease. His past medical history is significant for asthma. There is no history of bronchiectasis, bronchitis, COPD, emphysema or pneumonia.  Rash This is a chronic problem. The current episode started more than 1 year ago. The problem has been waxing and waning since onset. The affected locations include the torso. The rash is characterized by scaling, itchiness and dryness. He was exposed to nothing. Associated symptoms include coughing. Pertinent negatives include no fatigue, fever, joint pain or nail changes. Past treatments include topical steroids. The treatment provided significant relief. His past medical history is significant for asthma. There is no history of allergies, eczema or varicella.   Essential hypertension Chronic elevated BP without use of medication Still elevated after repeat check 58mns later Asymptomatic BP Readings from Last 3 Encounters:  10/14/20 (!) 165/120  03/25/20 (!) 160/90  06/13/18 (!) 138/94   Check TSH, BMP and CBC Start amlodipine and valsartan Advised about need for DASH diet and exercise F/up in 153monthAsthma Uncontrolled with SABA, use daily due to chest tightness, wheezing and cough. Trigger by dust, pollen and warm weather Start symbicort daily Continue SABA prn, refill  sent  Reviewed past Medical, Social and Family history today.  Outpatient Medications Prior to Visit  Medication Sig Dispense Refill  . cetirizine (ZYRTEC) 10 MG tablet Take 10 mg by mouth daily as needed.    . ferrous gluconate (FERGON) 325 MG tablet Take 325 mg by mouth 2 (two) times daily.    . fluticasone (FLONASE) 50 MCG/ACT nasal spray Place 1 spray into both nostrils daily.    . Marland Kitchenlbuterol (VENTOLIN HFA) 108 (90 Base) MCG/ACT inhaler INHALE TWO PUFFS BY MOUTH EVERY 4 TO 6 HOURS AS NEEDED 8.5 g 11  . fluocinonide-emollient (LIDEX-E) 0.05 % cream Apply 1 application topically 2 (two) times daily. 30 g 0  . ranitidine (ZANTAC) 150 MG capsule 3 capsules by mouth once daily  (Patient not taking: Reported on 03/25/2020)     No facility-administered medications prior to visit.   ROS See HPI  Objective:  BP (!) 165/120 (BP Location: Right Arm, Patient Position: Sitting, Cuff Size: Large)   Pulse 83   Temp 98.5 F (36.9 C) (Temporal)   Ht 6' 1.5" (1.867 m)   Wt (!) 351 lb 6.4 oz (159.4 kg)   SpO2 97%   BMI 45.73 kg/m   Physical Exam Vitals reviewed.  Constitutional:      Appearance: He is obese.  Cardiovascular:     Rate and Rhythm: Normal rate and regular rhythm.     Pulses: Normal pulses.     Heart sounds: Normal heart sounds.  Pulmonary:     Effort: Pulmonary effort is normal.     Breath sounds: Normal breath sounds.  Musculoskeletal:     Right lower leg: No edema.  Skin:    Findings: Rash  present. No erythema.  Neurological:     Mental Status: He is alert and oriented to person, place, and time.  Psychiatric:        Mood and Affect: Mood normal.        Behavior: Behavior normal.        Thought Content: Thought content normal.    Assessment & Plan:  This visit occurred during the SARS-CoV-2 public health emergency.  Safety protocols were in place, including screening questions prior to the visit, additional usage of staff PPE, and extensive cleaning of exam room  while observing appropriate contact time as indicated for disinfecting solutions.   Benjamin Tapia was seen today for establish care.  Diagnoses and all orders for this visit:  Essential hypertension -     CBC -     Comprehensive metabolic panel -     TSH -     amLODipine-valsartan (EXFORGE) 5-160 MG tablet; Take 1 tablet by mouth daily.  Morbid obesity ([place]) -     Lipid panel -     Comprehensive metabolic panel  Moderate persistent asthma without complication  Moderate persistent asthma, unspecified whether complicated -     albuterol (VENTOLIN HFA) 108 (90 Base) MCG/ACT inhaler; Inhale 1-2 puffs into the lungs every 6 (six) hours as needed for wheezing or shortness of breath. -     budesonide-formoterol (SYMBICORT) 160-4.5 MCG/ACT inhaler; Inhale 1 puff into the lungs in the morning and at bedtime. Rinse mouth after each use  Psoriasis -     fluocinonide-emollient (LIDEX-E) 0.05 % cream; Apply 1 application topically 2 (two) times daily.  Need for immunization against influenza -     Flu Vaccine QUAD 36+ mos IM    Problem List Items Addressed This Visit      Cardiovascular and Mediastinum   Essential hypertension - Primary    Chronic elevated BP without use of medication Still elevated after repeat check 21mns later Asymptomatic BP Readings from Last 3 Encounters:  10/14/20 (!) 165/120  03/25/20 (!) 160/90  06/13/18 (!) 138/94   Check TSH, BMP and CBC Start amlodipine and valsartan Advised about need for DASH diet and exercise F/up in 166month    Relevant Medications   amLODipine-valsartan (EXFORGE) 5-160 MG tablet   Other Relevant Orders   CBC (Completed)   Comprehensive metabolic panel (Completed)   TSH (Completed)     Respiratory   Asthma    Uncontrolled with SABA, use daily due to chest tightness, wheezing and cough. Trigger by dust, pollen and warm weather Start symbicort daily Continue SABA prn, refill sent      Relevant Medications   albuterol  (VENTOLIN HFA) 108 (90 Base) MCG/ACT inhaler   budesonide-formoterol (SYMBICORT) 160-4.5 MCG/ACT inhaler     Musculoskeletal and Integument   Psoriasis   Relevant Medications   fluocinonide-emollient (LIDEX-E) 0.05 % cream    Other Visit Diagnoses    [place]      Relevant Orders   Lipid panel (Completed)   Comprehensive metabolic panel (Completed)   Need for immunization against influenza       Relevant Orders   Flu Vaccine QUAD 36+ mos IM (Completed)      Follow-up: Return in about 4 weeks (around 11/11/2020) for CPE (fasting).  ChWilfred LacyNP

## 2020-10-16 NOTE — Assessment & Plan Note (Signed)
Uncontrolled with SABA, use daily due to chest tightness, wheezing and cough. Trigger by dust, pollen and warm weather Start symbicort daily Continue SABA prn, refill sent

## 2020-11-23 ENCOUNTER — Other Ambulatory Visit: Payer: Self-pay

## 2020-11-23 ENCOUNTER — Ambulatory Visit (INDEPENDENT_AMBULATORY_CARE_PROVIDER_SITE_OTHER): Payer: BC Managed Care – PPO | Admitting: Nurse Practitioner

## 2020-11-23 ENCOUNTER — Encounter: Payer: Self-pay | Admitting: Nurse Practitioner

## 2020-11-23 VITALS — BP 160/100 | HR 83 | Temp 98.1°F | Ht 73.0 in | Wt 349.6 lb

## 2020-11-23 DIAGNOSIS — Z125 Encounter for screening for malignant neoplasm of prostate: Secondary | ICD-10-CM | POA: Diagnosis not present

## 2020-11-23 DIAGNOSIS — Z0001 Encounter for general adult medical examination with abnormal findings: Secondary | ICD-10-CM | POA: Diagnosis not present

## 2020-11-23 DIAGNOSIS — J4520 Mild intermittent asthma, uncomplicated: Secondary | ICD-10-CM | POA: Diagnosis not present

## 2020-11-23 DIAGNOSIS — Z1211 Encounter for screening for malignant neoplasm of colon: Secondary | ICD-10-CM | POA: Diagnosis not present

## 2020-11-23 DIAGNOSIS — I10 Essential (primary) hypertension: Secondary | ICD-10-CM | POA: Diagnosis not present

## 2020-11-23 MED ORDER — AMLODIPINE BESYLATE 5 MG PO TABS: 5.0000 mg | ORAL_TABLET | Freq: Every day | ORAL | 5 refills | Status: DC

## 2020-11-23 NOTE — Progress Notes (Signed)
Subjective:    Patient ID: Benjamin Tapia, male    DOB: [birth year], 55 y.o.   MRN: [id number]  Patient presents today for CPE and eval of chronic conditions  HPI Essential hypertension  repeat BP: 158/100 Home SBP readings: 140s-150s No longer has AM headaches with medication compliance. Compliant with CPAP machaine No LE edema Has made some dietary changes but there is room for more No exercise regimen BP Readings from Last 3 Encounters:  11/23/20 (!) 160/100  10/14/20 (!) 165/120  03/25/20 (!) 160/90   Maintain exforge dose 5/165m in AM Add amlodipine in PM F/up in 142monthAsthma Improved with use of symbicort Has not needed SABA in last 55m24monthntinue current inhaler  Sexual History (orientation,birth control, marital status, STD): male partner, no need for STD screen, no urinary dysfunction. Prefers to complete cologuard kit for colon cancer screen  Depression/Suicide: [facility] 11/23/2020 06/13/2018 06/07/2017 11/04/2015  Decreased Interest 0 0 0 0  Down, Depressed, Hopeless 0 0 0 0  PHQ - 2 Score 0 0 0 0  Altered sleeping 0 - - -  Tired, decreased energy 0 - - -  Change in appetite 1 - - -  Feeling bad or failure about yourself  0 - - -  Trouble concentrating 0 - - -  Moving slowly or fidgety/restless 0 - - -  Suicidal thoughts 0 - - -  PHQ-9 Score 1 - - -  Difficult doing work/chores Not difficult at all - - -   Vision:up to date  Dental:up to date  Immunizations: (TDAP, Hep C screen, Pneumovax, Influenza, zoster)  Health Maintenance  Topic Date Due  .  Hepatitis C: One time screening is recommended by Center for Disease Control  (CDC) for  adults born from [birth year]rough 1963.   Never done  . Cologuard (Stool DNA test)  Never done  . COVID-19 Vaccine (2 - Booster for Janssen series) 06/07/2020  . Tetanus Vaccine  06/13/2028  . Flu Shot  Completed  . HPV Vaccine  Aged Out  . HIV Screening  Discontinued   Diet:low sodium  diet Exercise: none Weight:  Wt Readings from Last 3 Encounters:  11/23/20 (!) 349 lb 9.6 oz (158.6 kg)  10/14/20 (!) 351 lb 6.4 oz (159.4 kg)  03/25/20 (!) 348 lb 3.2 oz (157.9 kg)   Fall Risk: Fall Risk  11/23/2020 06/13/2018 06/07/2017  Falls in the past year? 0 No No  Number falls in past yr: 0 - -  Injury with Fall? 0 - -  Risk for fall due to : No Fall Risks - -  Follow up Falls evaluation completed - -   Medications and allergies reviewed with patient and updated if appropriate.  Patient Active Problem List   Diagnosis Date Noted  . Essential hypertension 10/16/2020  . BMI 45.0-49.9, adult ([place]CC[place]0/17/2019  . Accessory skin tags 06/13/2018  . Psoriasis 11/04/2015  . Routine general medical examination at a health care facility 03/25/2014  . Hypersomnia with sleep apnea 10/17/2010  . CELIAC DISEASE 09/10/2009  . ANEMIA, IRON DEFICIENCY 07/21/2009  . ALLERGIC RHINITIS 07/09/2009  . Asthma 07/09/2009    Current Outpatient Medications on File Prior to Visit  Medication Sig Dispense Refill  . albuterol (VENTOLIN HFA) 108 (90 Base) MCG/ACT inhaler Inhale 1-2 puffs into the lungs every 6 (six) hours as needed for wheezing or shortness of breath. 8.5 g 0  . budesonide-formoterol (SYMBICORT) 160-4.5 MCG/ACT inhaler Inhale 1 puff into the lungs  in the morning and at bedtime. Rinse mouth after each use 1 each 5  . cetirizine (ZYRTEC) 10 MG tablet Take 10 mg by mouth daily as needed.    . ferrous gluconate (FERGON) 325 MG tablet Take 325 mg by mouth 2 (two) times daily.    . fluocinonide-emollient (LIDEX-E) 0.05 % cream Apply 1 application topically 2 (two) times daily. 60 g 0  . fluticasone (FLONASE) 50 MCG/ACT nasal spray Place 1 spray into both nostrils daily.    Marland Kitchen amLODipine-valsartan (EXFORGE) 5-160 MG tablet Take 1 tablet by mouth daily. 30 tablet 5   No current facility-administered medications on file prior to visit.    Past Medical History:  Diagnosis Date  .  ALLERGIC RHINITIS   . Asthma   . Celiac sprue   . Sleep apnea     Past Surgical History:  Procedure Laterality Date  . TONSILLECTOMY      Social History   Socioeconomic History  . Marital status: Married    Spouse name: Not on file  . Number of children: 2  . Years of education: Not on file  . Highest education level: Not on file  Occupational History  . 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 Smoking status: Never Smoker  . Smokeless tobacco: Never Used  Substance and Sexual Activity  . Alcohol use: No  . Drug use: No  . Sexual activity: Not on file  Other Topics Concern  . Not on file  Social History Narrative   Regular exercise - no   2 daughters 56 and 52 in [place] Determinants of Health   Financial Resource Strain: Not on file  Food Insecurity: Not on file  Transportation Needs: Not on file  Physical Activity: Not on file  Stress: Not on file  Social Connections: Not on file   Family History  Problem Relation Age of Onset  . Hypertension Mother   . Hypertension Father   . Other Father        HLD  . Leukemia Maternal Grandfather   . Stroke Paternal Grandfather        Review of Systems  Constitutional: Negative for fever, malaise/fatigue and weight loss.  HENT: Negative for congestion and sore throat.   Eyes:       Negative for visual changes  Respiratory: Negative for cough and shortness of breath.   Cardiovascular: Negative for chest pain, palpitations and leg swelling.  Gastrointestinal: Negative for blood in stool, constipation, diarrhea and heartburn.  Genitourinary: Negative for dysuria, frequency and urgency.  Musculoskeletal: Negative for falls, joint pain and myalgias.  Skin: Negative for rash.  Neurological: Negative for dizziness, sensory change and headaches.  Endo/Heme/Allergies: Does not bruise/bleed easily.  Psychiatric/Behavioral: Negative for depression, substance abuse and  suicidal ideas. The patient is not nervous/anxious and does not have insomnia.    Objective:   Vitals:   11/23/20 0928  BP: (!) 160/100  Pulse: 83  Temp: 98.1 F (36.7 C)  SpO2: 98%   Body mass index is 46.12 kg/m.  Physical Examination:  Physical Exam Vitals reviewed.  Constitutional:      General: He is not in acute distress.    Appearance: He is obese.  HENT:     Right Ear: External ear normal.     Left Ear: External ear normal.     Mouth/Throat:     Pharynx: No oropharyngeal exudate.  Eyes:     General:  No scleral icterus.    Extraocular Movements: Extraocular movements intact.     Conjunctiva/sclera: Conjunctivae normal.  Neck:     Thyroid: No thyromegaly.  Cardiovascular:     Rate and Rhythm: Normal rate and regular rhythm.     Pulses: Normal pulses.     Heart sounds: Normal heart sounds.  Pulmonary:     Effort: Pulmonary effort is normal.     Breath sounds: Normal breath sounds.  Chest:     Chest wall: No tenderness.  Abdominal:     General: Bowel sounds are normal. There is no distension.     Palpations: Abdomen is soft.     Tenderness: There is no abdominal tenderness.  Genitourinary:    Comments: declined Musculoskeletal:        General: No tenderness. Normal range of motion.     Cervical back: Normal range of motion and neck supple.     Right lower leg: No edema.     Left lower leg: No edema.  Lymphadenopathy:     Cervical: No cervical adenopathy.  Skin:    General: Skin is warm and dry.  Neurological:     Mental Status: He is alert and oriented to person, place, and time.  Psychiatric:        Mood and Affect: Mood normal.        Behavior: Behavior normal.        Thought Content: Thought content normal.        Judgment: Judgment normal.    ASSESSMENT and PLAN: This visit occurred during the SARS-CoV-2 public health emergency.  Safety protocols were in place, including screening questions prior to the visit, additional usage of staff PPE,  and extensive cleaning of exam room while observing appropriate contact time as indicated for disinfecting solutions.   Tykwon was seen today for annual exam.  Diagnoses and all orders for this visit:  Encounter for preventative adult health care exam with abnormal findings  Essential hypertension -     Basic metabolic panel; Future -     Discontinue: amLODipine (NORVASC) 5 MG tablet; Take 1 tablet (5 mg total) by mouth at bedtime. -     amLODipine (NORVASC) 5 MG tablet; Take 1 tablet (5 mg total) by mouth at bedtime.  Colon cancer screening -     Cologuard; Future  Mild intermittent asthma without complication  Prostate cancer screening -     PSA; Future      Problem List Items Addressed This Visit      Cardiovascular and Mediastinum   Essential hypertension     repeat BP: 158/100 Home SBP readings: 140s-150s No longer has AM headaches with medication compliance. Compliant with CPAP machaine No LE edema Has made some dietary changes but there is room for more No exercise regimen BP Readings from Last 3 Encounters:  11/23/20 (!) 160/100  10/14/20 (!) 165/120  03/25/20 (!) 160/90   Maintain exforge dose 5/149m in AM Add amlodipine in PM F/up in 164month    Relevant Medications   amLODipine-valsartan (EXFORGE) 5-160 MG tablet   amLODipine (NORVASC) 5 MG tablet   Other Relevant Orders   Basic metabolic panel     Respiratory   Asthma    Improved with use of symbicort Has not needed SABA in last 8m24monthntinue current inhaler       Other Visit Diagnoses    Encounter for preventative adult health care exam with abnormal findings    -  Primary  Colon cancer screening       Relevant Orders   Cologuard   Prostate cancer screening       Relevant Orders   PSA      Follow up: Return in about 4 weeks (around 12/21/2020) for HTN.  Wilfred Lacy, NP

## 2020-11-23 NOTE — Patient Instructions (Signed)
Continue current dose of exforge Add amlodipine 54m at bedtime.  Maintain appt with dermatology You will be contacted to confirm cologuard order.  Maintain DASH diet and daily exercise.

## 2020-11-23 NOTE — Assessment & Plan Note (Signed)
Improved with use of symbicort Has not needed SABA in last 17monthContinue current inhaler

## 2020-11-23 NOTE — Assessment & Plan Note (Signed)
repeat BP: 158/100 Home SBP readings: 140s-150s No longer has AM headaches with medication compliance. Compliant with CPAP machaine No LE edema Has made some dietary changes but there is room for more No exercise regimen BP Readings from Last 3 Encounters:  11/23/20 (!) 160/100  10/14/20 (!) 165/120  03/25/20 (!) 160/90   Maintain exforge dose 5/153m in AM Add amlodipine in PM F/up in 18month

## 2020-12-24 ENCOUNTER — Ambulatory Visit: Payer: BC Managed Care – PPO | Admitting: Nurse Practitioner

## 2020-12-24 ENCOUNTER — Encounter: Payer: Self-pay | Admitting: Nurse Practitioner

## 2020-12-24 ENCOUNTER — Other Ambulatory Visit: Payer: Self-pay

## 2020-12-24 VITALS — BP 130/84 | HR 78 | Temp 98.4°F | Ht 74.0 in | Wt 346.2 lb

## 2020-12-24 DIAGNOSIS — Z125 Encounter for screening for malignant neoplasm of prostate: Secondary | ICD-10-CM

## 2020-12-24 DIAGNOSIS — I10 Essential (primary) hypertension: Secondary | ICD-10-CM | POA: Diagnosis not present

## 2020-12-24 DIAGNOSIS — R739 Hyperglycemia, unspecified: Secondary | ICD-10-CM | POA: Diagnosis not present

## 2020-12-24 LAB — PSA: PSA: 0.29 ng/mL (ref 0.10–4.00)

## 2020-12-24 LAB — HEMOGLOBIN A1C: Hgb A1c MFr Bld: 5.9 % (ref 4.6–6.5)

## 2020-12-24 NOTE — Progress Notes (Signed)
Subjective:  Patient ID: Benjamin Tapia, male    DOB: 1966-07-19  Age: 55 y.o. MRN: [id number]  CC: Follow-up (4 week f/u on HTN. /Checks BP at home ranges 130-140s/70-80s)  HPI  Essential hypertension BP at goal with amlodipine in PM and exforge in AM, but he is now experiencing ankle edema. This improves with elevation. No SOB or cough or wheezing. No weight gain noted Wt Readings from Last 3 Encounters:  12/24/20 (!) 346 lb 3.2 oz (157 kg)  11/23/20 (!) 349 lb 9.6 oz (158.6 kg)  10/14/20 (!) 351 lb 6.4 oz (159.4 kg)   BP Readings from Last 3 Encounters:  12/24/20 130/84  11/23/20 (!) 160/100  10/14/20 (!) 165/120   Advised to take exforge and amlodipine in PM. Maintain current medication dose, DASH diet and daily exercise. F/up in 28month  BP Readings from Last 3 Encounters:  12/24/20 130/84  11/23/20 (!) 160/100  10/14/20 (!) 165/120   Wt Readings from Last 3 Encounters:  12/24/20 (!) 346 lb 3.2 oz (157 kg)  11/23/20 (!) 349 lb 9.6 oz (158.6 kg)  10/14/20 (!) 351 lb 6.4 oz (159.4 kg)    Reviewed past Medical, Social and Family history today.  Outpatient Medications Prior to Visit  Medication Sig Dispense Refill  . albuterol (VENTOLIN HFA) 108 (90 Base) MCG/ACT inhaler Inhale 1-2 puffs into the lungs every 6 (six) hours as needed for wheezing or shortness of breath. 8.5 g 0  . amLODipine (NORVASC) 5 MG tablet Take 1 tablet (5 mg total) by mouth at bedtime. 30 tablet 5  . amLODipine-valsartan (EXFORGE) 5-160 MG tablet Take 1 tablet by mouth daily. 30 tablet 5  . budesonide-formoterol (SYMBICORT) 160-4.5 MCG/ACT inhaler Inhale 1 puff into the lungs in the morning and at bedtime. Rinse mouth after each use 1 each 5  . cetirizine (ZYRTEC) 10 MG tablet Take 10 mg by mouth daily as needed.    . ferrous gluconate (FERGON) 325 MG tablet Take 325 mg by mouth 2 (two) times daily.    . fluocinonide-emollient (LIDEX-E) 0.05 % cream Apply 1 application topically 2 (two) times  daily. 60 g 0  . fluticasone (FLONASE) 50 MCG/ACT nasal spray Place 1 spray into both nostrils daily.     No facility-administered medications prior to visit.    ROS See HPI  Objective:  BP 130/84 (BP Location: Left Arm, Patient Position: Sitting, Cuff Size: Large)   Pulse 78   Temp 98.4 F (36.9 C) (Temporal)   Ht 6' 2"  (1.88 m)   Wt (!) 346 lb 3.2 oz (157 kg)   SpO2 98%   BMI 44.45 kg/m   Physical Exam Vitals reviewed.  Neurological:     Mental Status: He is alert.     Assessment & Plan:  This visit occurred during the SARS-CoV-2 public health emergency.  Safety protocols were in place, including screening questions prior to the visit, additional usage of staff PPE, and extensive cleaning of exam room while observing appropriate contact time as indicated for disinfecting solutions.   WTaniawas seen today for follow-up.  Diagnoses and all orders for this visit:  Essential hypertension  Hyperglycemia -     Hemoglobin A1c  Prostate cancer screening -     PSA    Problem List Items Addressed This Visit      Cardiovascular and Mediastinum   Essential hypertension - Primary    BP at goal with amlodipine in PM and exforge in AM, but he is now experiencing  ankle edema. This improves with elevation. No SOB or cough or wheezing. No weight gain noted Wt Readings from Last 3 Encounters:  12/24/20 (!) 346 lb 3.2 oz (157 kg)  11/23/20 (!) 349 lb 9.6 oz (158.6 kg)  10/14/20 (!) 351 lb 6.4 oz (159.4 kg)   BP Readings from Last 3 Encounters:  12/24/20 130/84  11/23/20 (!) 160/100  10/14/20 (!) 165/120   Advised to take exforge and amlodipine in PM. Maintain current medication dose, DASH diet and daily exercise. F/up in 79month       Other Visit Diagnoses    Hyperglycemia       Relevant Orders   Hemoglobin A1c   Prostate cancer screening       Relevant Orders   PSA      Follow-up: Return in about 3 months (around 03/25/2021) for HTN.  CWilfred Lacy NP

## 2020-12-24 NOTE — Patient Instructions (Addendum)
Take amlodipine and exforge in Pm to help minimize ankle swelling. If no improvement in 1-3weeks, call office. Continue to DASH diet and start daily exercise.  Go to lab for blood draw.

## 2020-12-24 NOTE — Assessment & Plan Note (Signed)
BP at goal with amlodipine in PM and exforge in AM, but he is now experiencing ankle edema. This improves with elevation. No SOB or cough or wheezing. No weight gain noted Wt Readings from Last 3 Encounters:  12/24/20 (!) 346 lb 3.2 oz (157 kg)  11/23/20 (!) 349 lb 9.6 oz (158.6 kg)  10/14/20 (!) 351 lb 6.4 oz (159.4 kg)   BP Readings from Last 3 Encounters:  12/24/20 130/84  11/23/20 (!) 160/100  10/14/20 (!) 165/120   Advised to take exforge and amlodipine in PM. Maintain current medication dose, DASH diet and daily exercise. F/up in 39month

## 2021-02-07 ENCOUNTER — Other Ambulatory Visit: Payer: Self-pay | Admitting: Nurse Practitioner

## 2021-02-07 ENCOUNTER — Other Ambulatory Visit: Payer: Self-pay

## 2021-02-07 DIAGNOSIS — I10 Essential (primary) hypertension: Secondary | ICD-10-CM

## 2021-02-08 ENCOUNTER — Ambulatory Visit: Payer: BC Managed Care – PPO | Admitting: Nurse Practitioner

## 2021-02-08 ENCOUNTER — Encounter: Payer: Self-pay | Admitting: Nurse Practitioner

## 2021-02-08 VITALS — BP 142/82 | HR 82 | Temp 98.3°F | Ht 74.0 in | Wt 338.0 lb

## 2021-02-08 DIAGNOSIS — M7989 Other specified soft tissue disorders: Secondary | ICD-10-CM | POA: Diagnosis not present

## 2021-02-08 DIAGNOSIS — I10 Essential (primary) hypertension: Secondary | ICD-10-CM

## 2021-02-08 DIAGNOSIS — M79661 Pain in right lower leg: Secondary | ICD-10-CM | POA: Diagnosis not present

## 2021-02-08 MED ORDER — AMLODIPINE BESYLATE-VALSARTAN 5-160 MG PO TABS: 1.0000 | ORAL_TABLET | Freq: Every day | ORAL | 3 refills | Status: DC

## 2021-02-08 MED ORDER — AMLODIPINE BESYLATE 5 MG PO TABS: 5.0000 mg | ORAL_TABLET | Freq: Every day | ORAL | 3 refills | Status: DC

## 2021-02-08 NOTE — Patient Instructions (Signed)
You will be contacted to schedule appt for venous doppler. Use compression stocking during the day and off at night.  HTN medications refilled

## 2021-02-08 NOTE — Progress Notes (Signed)
Subjective:  Patient ID: Benjamin Tapia, male    DOB: 1965-10-23  Age: 55 y.o. MRN: [id number]  CC: Leg Swelling (Right calf swelling and cramping )  Leg Pain  The incident occurred 5 to 7 days ago. The injury mechanism is unknown. The pain is present in the right leg and right knee. The quality of the pain is described as aching (tightness). The pain is mild. The pain has been Constant since onset. Pertinent negatives include no inability to bear weight, muscle weakness, numbness or tingling. He reports no foreign bodies present. The symptoms are aggravated by weight bearing. He has tried nothing for the symptoms.  Last Friday he was kneeling while working, heard a pop sound while trying to stand, had knee pain for several days which has now resolved, but now developed calf swelling and tightness. Calf pain is worse with walking. He is concerned about DVT because of father's hx of clotting disorder (diagnosed at age 75)  He also needs BP meds refilled. BP Readings from Last 3 Encounters:  02/08/21 (!) 142/82  12/24/20 130/84  11/23/20 (!) 160/100    Reviewed past Medical, Social and Family history today.  Outpatient Medications Prior to Visit  Medication Sig Dispense Refill   albuterol (VENTOLIN HFA) 108 (90 Base) MCG/ACT inhaler Inhale 1-2 puffs into the lungs every 6 (six) hours as needed for wheezing or shortness of breath. 8.5 g 0   budesonide-formoterol (SYMBICORT) 160-4.5 MCG/ACT inhaler Inhale 1 puff into the lungs in the morning and at bedtime. Rinse mouth after each use 1 each 5   cetirizine (ZYRTEC) 10 MG tablet Take 10 mg by mouth daily as needed.     ferrous gluconate (FERGON) 325 MG tablet Take 325 mg by mouth 2 (two) times daily.     fluocinonide-emollient (LIDEX-E) 0.05 % cream Apply 1 application topically 2 (two) times daily. 60 g 0   fluticasone (FLONASE) 50 MCG/ACT nasal spray Place 1 spray into both nostrils daily.     amLODipine (NORVASC) 5 MG tablet Take 1 tablet (5  mg total) by mouth at bedtime. 30 tablet 5   amLODipine-valsartan (EXFORGE) 5-160 MG tablet Take 1 tablet by mouth daily. 30 tablet 5   No facility-administered medications prior to visit.    ROS See HPI  Objective:  BP (!) 142/82   Pulse 82   Temp 98.3 F (36.8 C)   Ht 6' 2"  (1.88 m)   Wt (!) 338 lb (153.3 kg)   SpO2 97%   BMI 43.40 kg/m   Physical Exam Constitutional:      Appearance: He is obese.  Cardiovascular:     Rate and Rhythm: Normal rate.     Pulses: Normal pulses.  Pulmonary:     Effort: Pulmonary effort is normal.  Musculoskeletal:        General: Swelling present.     Right knee: Crepitus present. No swelling, effusion, erythema or bony tenderness. Normal range of motion. No tenderness. Normal patellar mobility.     Left knee: Normal.     Right lower leg: No tenderness or bony tenderness. Edema present.     Left lower leg: No edema.     Right ankle: Swelling present. No tenderness. Normal range of motion.     Right Achilles Tendon: Normal.     Left ankle:     Left Achilles Tendon: Normal.  Skin:    General: Skin is warm and dry.     Findings: No erythema.  Neurological:  Mental Status: He is alert and oriented to person, place, and time.   Assessment & Plan:  This visit occurred during the SARS-CoV-2 public health emergency.  Safety protocols were in place, including screening questions prior to the visit, additional usage of staff PPE, and extensive cleaning of exam room while observing appropriate contact time as indicated for disinfecting solutions.   Benjamin Tapia was seen today for leg swelling.  Diagnoses and all orders for this visit:  Pain and swelling of right lower leg -     VAS [place] LOWER EXTREMITY VENOUS (DVT); Future  Essential hypertension -     amLODipine-valsartan (EXFORGE) 5-160 MG tablet; Take 1 tablet by mouth daily. In combination with amlodipine 4m -     amLODipine (NORVASC) 5 MG tablet; Take 1 tablet (5 mg total) by mouth at  bedtime. Refer to sports medicine if negative venous doppler. Advised to wear compression stocking.  Problem List Items Addressed This Visit       Cardiovascular and Mediastinum   Essential hypertension   Relevant Medications   amLODipine-valsartan (EXFORGE) 5-160 MG tablet   amLODipine (NORVASC) 5 MG tablet   Other Visit Diagnoses     Pain and swelling of right lower leg    -  Primary   Relevant Orders   VAS [place]EN[place] (DVT)       Follow-up: Return in about 3 months (around 05/11/2021) for HTN and hyperglycemia (fasting).  CWilfred Lacy NP

## 2021-02-09 ENCOUNTER — Other Ambulatory Visit: Payer: Self-pay

## 2021-02-09 ENCOUNTER — Ambulatory Visit (HOSPITAL_COMMUNITY)
Admission: RE | Admit: 2021-02-09 | Discharge: 2021-02-09 | Disposition: A | Discharge status: Home / Self Care | Payer: BC Managed Care – PPO | Source: Ambulatory Visit | Attending: Nurse Practitioner | Admitting: Nurse Practitioner

## 2021-02-09 ENCOUNTER — Telehealth: Payer: Self-pay

## 2021-02-09 DIAGNOSIS — M79661 Pain in right lower leg: Secondary | ICD-10-CM

## 2021-02-09 DIAGNOSIS — M7989 Other specified soft tissue disorders: Secondary | ICD-10-CM | POA: Diagnosis present

## 2021-02-09 DIAGNOSIS — S86111A Strain of other muscle(s) and tendon(s) of posterior muscle group at lower leg level, right leg, initial encounter: Secondary | ICD-10-CM

## 2021-02-09 NOTE — Telephone Encounter (Signed)
Rachel  at the Vascular lab called:   Patient's right leg calf was negative for a DVT. There is a focal collection in the right right calf. Possible muscle tear or hematoma. Patient has been discharged from the Vascular lab.

## 2021-02-09 NOTE — Telephone Encounter (Signed)
Inform Benjamin Tapia about US findings. He should use compression stocking as previous discussed. I also entered referral to Sports medicine for additional eval of muscle tear.

## 2021-02-09 NOTE — Telephone Encounter (Signed)
Patient advised and informed.

## 2021-02-09 NOTE — Progress Notes (Signed)
Lower extremity venous RT study completed.  Preliminary results relayed to Shirlean Mylar, medical assistant for [place], NP.  See CV Proc for preliminary results report.   Darlin Coco, RDMS, RVT

## 2021-02-14 ENCOUNTER — Encounter: Payer: Self-pay | Admitting: Family Medicine

## 2021-02-14 ENCOUNTER — Ambulatory Visit: Payer: BC Managed Care – PPO | Admitting: Family Medicine

## 2021-02-14 ENCOUNTER — Other Ambulatory Visit: Payer: Self-pay

## 2021-02-14 ENCOUNTER — Ambulatory Visit: Payer: Self-pay

## 2021-02-14 VITALS — BP 150/90 | HR 90 | Ht 74.0 in | Wt 338.0 lb

## 2021-02-14 DIAGNOSIS — M79604 Pain in right leg: Secondary | ICD-10-CM

## 2021-02-14 DIAGNOSIS — T792XXA Traumatic secondary and recurrent hemorrhage and seroma, initial encounter: Secondary | ICD-10-CM

## 2021-02-14 DIAGNOSIS — S86111A Strain of other muscle(s) and tendon(s) of posterior muscle group at lower leg level, right leg, initial encounter: Secondary | ICD-10-CM | POA: Diagnosis not present

## 2021-02-14 NOTE — Progress Notes (Signed)
Benjamin Tapia, am serving as a Education administrator for Dr. Lynne Leader.   Subjective:    I'm seeing this patient as a consultation for: Dr. Wilfred Lacy. Note will be routed back to referring provider/PCP.  CC: Right knee and lower leg pain  HPI: Pt is a 55 y/o male c/o R knee and lower leg ongoing since early June w/ no known MOI. Pt experienced a "pop" sensation in his R knee/lower leg. Pt's PCP ordered a LE vascular US that was performed on 02/09/21. Pt locates pain to R calf Patient states that Thursday May9th was bending down to mess with his router and shifted heard a pop no pian or swelling but felt a catching sensation and a little weak. Patient states then a week later noticed he had a calf pain and swelling on R side. Patient states that after an appointment he kind of hopped off the table and had a sharp pain and stiff. Feels like a bad cramp. Patient did have a scan for DVT that was negative.   Swelling:yes Radiates: no  Mechanical symptoms:no Aggravates: weight bearing, working  Treatments tried: ice, heat, resting, advil   Dx testing: 02/09/21 LE vascular UE  Past medical history, Surgical history, Family history, Social history, Allergies, and medications have been entered into the medical record, reviewed.   Review of Systems: No new headache, visual changes, nausea, vomiting, diarrhea, constipation, dizziness, abdominal pain, skin rash, fevers, chills, night sweats, weight loss, swollen lymph nodes, body aches, joint swelling, muscle aches, chest pain, shortness of breath, mood changes, visual or auditory hallucinations.   Objective:    Vitals:   02/14/21 1450  BP: (!) 150/90  Pulse: 90  SpO2: 97%  Body mass index is 43.4 kg/m.  General: Well Developed, obese, and in no acute distress.  Neuro/Psych: Alert and oriented x3, extra-ocular muscles intact, able to move all 4 extremities, sensation grossly intact. Skin: Warm and dry, no rashes noted.  Respiratory: Not using  accessory muscles, speaking in full sentences, trachea midline.  Cardiovascular: Pulses palpable, no extremity edema. Abdomen: Does not appear distended. MSK: Right calf large swollen. Tender palpation medial gastrocnemius near muscle tendinous junction. Normal foot and ankle motion.  Lab and Radiology Results  Diagnostic Limited MSK Ultrasound of: Right calf Right gastrocnemius large hypoechoic structure medial gastrocnemius measuring approximately 1 x 5 x 10 cm.  This is present in the superficial medial gastrocnemius extending from the distal muscle tendinous junction to the proximal gastrocnemius.  No obvious muscle defect distally.  Slight muscle fiber irregularity proximally.  No large gap muscle fibers. No increased vascular activity. Impression: Large hematoma medial gastrocnemius thought to be secondary to tear   VAS [place] LOWER EXTREMITY VENOUS (DVT)  Result Date: 02/09/2021  Lower Venous DVT Study Patient Name:  Benjamin Tapia  Date of Exam:   02/09/2021 Medical Rec #: [id number]     Accession #:    [id number] Date of Birth: 26-Jul-1966     Patient Gender: M Patient Age:   55Y Exam Location:  [facility] Procedure:      VAS [place] LOWER EXTREMITY VENOUS (DVT) Referring Phys: [id number] CHARLOTTE LUM NCHE --------------------------------------------------------------------------------  Indications: Pain in RT calf, intermittent swelling.  Risk Factors: Patient describes recent "pop" sensation at the knee/calf area. Comparison Study: No prior studies. Performing Technologist: Darlin Coco RDMS,RVT  Examination Guidelines: A complete evaluation includes B-mode imaging, spectral Doppler, color Doppler, and power Doppler as needed of all accessible portions of each vessel. Bilateral testing  is considered an integral part of a complete examination. Limited examinations for reoccurring indications may be performed as noted. The reflux portion of the exam is performed with the patient in reverse  Trendelenburg.  +---------+---------------+---------+-----------+----------+--------------+ RIGHT    CompressibilityPhasicitySpontaneityPropertiesThrombus Aging +---------+---------------+---------+-----------+----------+--------------+ CFV      Full           Yes      Yes                                 +---------+---------------+---------+-----------+----------+--------------+ SFJ      Full                                                        +---------+---------------+---------+-----------+----------+--------------+ FV Prox  Full                                                        +---------+---------------+---------+-----------+----------+--------------+ FV Mid   Full                                                        +---------+---------------+---------+-----------+----------+--------------+ FV DistalFull                                                        +---------+---------------+---------+-----------+----------+--------------+ PFV      Full                                                        +---------+---------------+---------+-----------+----------+--------------+ POP      Full           Yes      Yes                                 +---------+---------------+---------+-----------+----------+--------------+ PTV      Full                                                        +---------+---------------+---------+-----------+----------+--------------+ PERO     Full                                                        +---------+---------------+---------+-----------+----------+--------------+ Gastroc  Full                                                        +---------+---------------+---------+-----------+----------+--------------+   +----+---------------+---------+-----------+----------+--------------+  LEFTCompressibilityPhasicitySpontaneityPropertiesThrombus Aging  +----+---------------+---------+-----------+----------+--------------+ CFV Full           Yes      Yes                                 +----+---------------+---------+-----------+----------+--------------+    Summary: RIGHT: - There is no evidence of deep vein thrombosis in the lower extremity.  - No cystic structure found in the popliteal fossa.  - Incidental: Heterogeneous collection superficial to the gastrocnemius muscle at area of most pain.  LEFT: - No evidence of common femoral vein obstruction.  *See table(s) above for measurements and observations. Electronically signed by Harold Barban MD on 02/09/2021 at 7:51:51 PM.    Final      Impression and Recommendations:    Assessment and Plan: 55 y.o. male with right gastrocnemius hematoma/developing seroma.  This is thought to be due to a gastrocnemius muscle tear probably proximally.  Fortunately patient has already had vascular ultrasound which confirmed no DVT.  Plan to treat with compression and eccentric exercises.  Recheck in 4 to 6 weeks.  If not improved at that time would consider aspiration.  Would like to avoid aspiration if possible as this would increase his risk of infection.  Discussed some compression options.  Ideally he would be a good candidate for body helix full calf compression sleeve.  However if his calf is too big for the large sleeve would recommend cutting the foot off of a large compression stocking and using that instead.Marland Kitchen    PDMP not reviewed this encounter. Orders Placed This Encounter  Procedures   [place] LIMITED JOINT SPACE STRUCTURES LOW RIGHT(NO LINKED CHARGES)    Standing Status:   Future    Number of Occurrences:   1    Standing Expiration Date:   02/14/2022    Order Specific Question:   Reason for Exam (SYMPTOM  OR DIAGNOSIS REQUIRED)    Answer:   righ leg pain    Order Specific Question:   Preferred imaging location?    Answer:   [facility]   No orders of the defined types were placed in this  encounter.   Discussed warning signs or symptoms. Please see discharge instructions. Patient expresses understanding.   The above documentation has been reviewed and is accurate and complete Lynne Leader, M.D.

## 2021-02-14 NOTE — Patient Instructions (Addendum)
Thank you for coming in today.   I recommend you obtained a compression sleeve to help with your joint problems. There are many options on the market however I recommend obtaining a full calf Body Helix compression sleeve.  You can find information (including how to appropriate measure yourself for sizing) can be found at www.Body http://www.lambert.com/.  Many of these products are health savings account (HSA) eligible.   You can use the compression sleeve at any time throughout the day but is most important to use while being active as well as for 2 hours post-activity.   It is appropriate to ice following activity with the compression sleeve in place.   Please complete the exercises that the athletic trainer went over with you:  View at my-exercise-code.com using code: [email or web address]  Recheck in 6 weeks.   Let me know if this does not work.

## 2021-03-09 ENCOUNTER — Other Ambulatory Visit: Payer: Self-pay | Admitting: Nurse Practitioner

## 2021-03-09 DIAGNOSIS — L409 Psoriasis, unspecified: Secondary | ICD-10-CM

## 2021-03-09 NOTE — Telephone Encounter (Signed)
Chart supports rx refill Last refill 10/31/2020 Last ov 02/08/2021

## 2021-03-25 ENCOUNTER — Ambulatory Visit: Payer: BC Managed Care – PPO | Admitting: Nurse Practitioner

## 2021-03-29 NOTE — Progress Notes (Signed)
   I, Benjamin Tapia, LAT, ATC, am serving as scribe for Dr. Lynne Leader.  Alby Schwabe is a 55 y.o. male who presents to [place] [facility] today for f/u of a proximal R gastroc strain/tear.  He was last seen by Dr. Georgina Snell on 02/14/21 and was shown a HEP focusing on eccentric strengthening and advised to use a calf compression sleeve.  Since his last visit, pt reports that his R calf pain has improved significantly but will be occasionally achy.  He is still having some swelling in his distal lower leg and ankle.   Pertinent review of systems: No fevers or chills  Relevant historical information: Hypertension.  Obesity.   Exam:  BP 132/88 (BP Location: Left Arm, Patient Position: Sitting, Cuff Size: Large)   Pulse 80   Ht 6' 2"  (1.88 m)   Wt (!) 336 lb (152.4 kg)   SpO2 98%   BMI 43.14 kg/m  General: Well Developed, well nourished, and in no acute distress.   MSK: Right leg nonpitting edema right lower extremity. Right calf slight swollen at medial gastrocnemius with mass palpated.  Nontender.  Normal Motion.  Intact strength.    Lab and Radiology Results  Diagnostic Limited MSK Ultrasound of: Right gastrocnemius Medial head gastrocnemius superficial to muscle is area of hypoechoic change consistent with seroma Impression: Seroma overlying right gastrocnemius muscle      Assessment and Plan: 55 y.o. male with right calf tear with now resulting seroma.  Improving but still present.  Recommend continued compression if able.  Continue home exercise program.  Recheck in 2 months unless all better.   PDMP not reviewed this encounter. Orders Placed This Encounter  Procedures   [place] LIMITED JOINT SPACE STRUCTURES LOW RIGHT(NO LINKED CHARGES)    Order Specific Question:   Reason for Exam (SYMPTOM  OR DIAGNOSIS REQUIRED)    Answer:   R calf pain    Order Specific Question:   Preferred imaging location?    Answer:   [facility]   No  orders of the defined types were placed in this encounter.    Discussed warning signs or symptoms. Please see discharge instructions. Patient expresses understanding.   The above documentation has been reviewed and is accurate and complete Lynne Leader, M.D.

## 2021-03-30 ENCOUNTER — Ambulatory Visit: Payer: Self-pay

## 2021-03-30 ENCOUNTER — Other Ambulatory Visit: Payer: Self-pay

## 2021-03-30 ENCOUNTER — Encounter: Payer: Self-pay | Admitting: Family Medicine

## 2021-03-30 ENCOUNTER — Ambulatory Visit (INDEPENDENT_AMBULATORY_CARE_PROVIDER_SITE_OTHER): Payer: BC Managed Care – PPO | Admitting: Family Medicine

## 2021-03-30 VITALS — BP 132/88 | HR 80 | Ht 74.0 in | Wt 336.0 lb

## 2021-03-30 DIAGNOSIS — M79661 Pain in right lower leg: Secondary | ICD-10-CM | POA: Diagnosis not present

## 2021-03-30 DIAGNOSIS — T792XXA Traumatic secondary and recurrent hemorrhage and seroma, initial encounter: Secondary | ICD-10-CM

## 2021-03-30 NOTE — Patient Instructions (Signed)
Thank you for coming in today.   Continue those exercises.   Continue compression if able.   Recheck in 2 months unless you are all better.

## 2021-05-08 ENCOUNTER — Other Ambulatory Visit: Payer: Self-pay | Admitting: Nurse Practitioner

## 2021-05-08 DIAGNOSIS — J4540 Moderate persistent asthma, uncomplicated: Secondary | ICD-10-CM

## 2021-05-11 ENCOUNTER — Other Ambulatory Visit: Payer: Self-pay | Admitting: Nurse Practitioner

## 2021-05-11 DIAGNOSIS — J4540 Moderate persistent asthma, uncomplicated: Secondary | ICD-10-CM

## 2021-05-11 MED ORDER — ALBUTEROL SULFATE HFA 108 (90 BASE) MCG/ACT IN AERS: 1.0000 | INHALATION_SPRAY | Freq: Four times a day (QID) | RESPIRATORY_TRACT | 0 refills | Status: DC | PRN

## 2021-05-18 ENCOUNTER — Ambulatory Visit: Payer: BC Managed Care – PPO | Admitting: Nurse Practitioner

## 2021-05-30 ENCOUNTER — Ambulatory Visit: Payer: BC Managed Care – PPO | Admitting: Family Medicine

## 2021-06-02 ENCOUNTER — Other Ambulatory Visit: Payer: Self-pay | Admitting: Nurse Practitioner

## 2021-06-02 DIAGNOSIS — J4540 Moderate persistent asthma, uncomplicated: Secondary | ICD-10-CM

## 2021-07-27 NOTE — Telephone Encounter (Signed)
Chart supports rx refill Last ov: 02/08/2021 Last refill: 05/11/2021

## 2021-10-07 ENCOUNTER — Other Ambulatory Visit: Payer: Self-pay | Admitting: Nurse Practitioner

## 2021-10-07 DIAGNOSIS — J4540 Moderate persistent asthma, uncomplicated: Secondary | ICD-10-CM

## 2021-12-13 ENCOUNTER — Telehealth: Payer: Self-pay | Admitting: Nurse Practitioner

## 2021-12-13 NOTE — Telephone Encounter (Signed)
Patient works in [employer] and is closer to [employer], looking to transfer to [employer] ? ?Please advise if this is okay ?

## 2022-01-19 ENCOUNTER — Other Ambulatory Visit: Payer: Self-pay | Admitting: Nurse Practitioner

## 2022-01-19 DIAGNOSIS — I10 Essential (primary) hypertension: Secondary | ICD-10-CM

## 2022-01-20 NOTE — Telephone Encounter (Signed)
Called pt to schedule appt for a HTN f/u. Left VM, adv to call back to get further refills for Rx.

## 2022-02-09 ENCOUNTER — Ambulatory Visit: Payer: BC Managed Care – PPO | Admitting: Family Medicine

## 2022-02-09 ENCOUNTER — Encounter: Payer: Self-pay | Admitting: Family Medicine

## 2022-02-09 VITALS — BP 132/78 | HR 79 | Temp 98.2°F | Resp 16 | Ht 73.0 in | Wt 344.6 lb

## 2022-02-09 DIAGNOSIS — K900 Celiac disease: Secondary | ICD-10-CM | POA: Diagnosis not present

## 2022-02-09 DIAGNOSIS — K5900 Constipation, unspecified: Secondary | ICD-10-CM | POA: Diagnosis not present

## 2022-02-09 DIAGNOSIS — E782 Mixed hyperlipidemia: Secondary | ICD-10-CM

## 2022-02-09 DIAGNOSIS — D509 Iron deficiency anemia, unspecified: Secondary | ICD-10-CM

## 2022-02-09 DIAGNOSIS — R7303 Prediabetes: Secondary | ICD-10-CM

## 2022-02-09 DIAGNOSIS — K429 Umbilical hernia without obstruction or gangrene: Secondary | ICD-10-CM

## 2022-02-09 DIAGNOSIS — G4710 Hypersomnia, unspecified: Secondary | ICD-10-CM

## 2022-02-09 DIAGNOSIS — Z1211 Encounter for screening for malignant neoplasm of colon: Secondary | ICD-10-CM

## 2022-02-09 DIAGNOSIS — G4730 Sleep apnea, unspecified: Secondary | ICD-10-CM

## 2022-02-09 DIAGNOSIS — L409 Psoriasis, unspecified: Secondary | ICD-10-CM

## 2022-02-09 DIAGNOSIS — I10 Essential (primary) hypertension: Secondary | ICD-10-CM

## 2022-02-09 DIAGNOSIS — J4540 Moderate persistent asthma, uncomplicated: Secondary | ICD-10-CM

## 2022-02-09 DIAGNOSIS — E785 Hyperlipidemia, unspecified: Secondary | ICD-10-CM | POA: Insufficient documentation

## 2022-02-09 DIAGNOSIS — Z125 Encounter for screening for malignant neoplasm of prostate: Secondary | ICD-10-CM

## 2022-02-09 MED ORDER — HYDROCHLOROTHIAZIDE 25 MG PO TABS: 25.0000 mg | ORAL_TABLET | Freq: Every day | ORAL | 0 refills | Status: DC

## 2022-02-09 MED ORDER — VALSARTAN 160 MG PO TABS: 160.0000 mg | ORAL_TABLET | Freq: Every day | ORAL | 3 refills | Status: DC

## 2022-02-09 MED ORDER — ALBUTEROL SULFATE HFA 108 (90 BASE) MCG/ACT IN AERS: 1.0000 | INHALATION_SPRAY | Freq: Four times a day (QID) | RESPIRATORY_TRACT | 1 refills | Status: DC | PRN

## 2022-02-09 MED ORDER — BUDESONIDE-FORMOTEROL FUMARATE 160-4.5 MCG/ACT IN AERO: INHALATION_SPRAY | RESPIRATORY_TRACT | 5 refills | Status: DC

## 2022-02-09 NOTE — Assessment & Plan Note (Signed)
Encouraged adherence to celiac diet.

## 2022-02-09 NOTE — Progress Notes (Signed)
Subjective:     Benjamin Tapia is a 56 y.o. male presenting for Establish Care     HPI  #HTN - bp controlled - getting leg swelling  #constipation - typically really regular - no change with magnesium - intermittent - water intake - 32 oz cup filled twice daily - diet - could be better - tries to eat healthy, protein/carbs/veggies - skips the veggies - no blood or dark or tarry stool  Review of Systems   Social History   Tobacco Use  Smoking Status Never  Smokeless Tobacco Never        Objective:    BP Readings from Last 3 Encounters:  02/09/22 132/78  03/30/21 132/88  02/14/21 (!) 150/90   Wt Readings from Last 3 Encounters:  02/09/22 (!) 344 lb 9 oz (156.3 kg)  03/30/21 (!) 336 lb (152.4 kg)  02/14/21 (!) 338 lb (153.3 kg)    BP 132/78   Pulse 79   Temp 98.2 F (36.8 C)   Resp 16   Ht 6' 1"  (1.854 m)   Wt (!) 344 lb 9 oz (156.3 kg)   SpO2 97%   BMI 45.46 kg/m    Physical Exam Constitutional:      Appearance: Normal appearance. He is not ill-appearing or diaphoretic.  HENT:     Right Ear: External ear normal.     Left Ear: External ear normal.     Nose: Nose normal.  Eyes:     General: No scleral icterus.    Extraocular Movements: Extraocular movements intact.     Conjunctiva/sclera: Conjunctivae normal.  Cardiovascular:     Rate and Rhythm: Normal rate and regular rhythm.     Heart sounds: No murmur heard. Pulmonary:     Effort: Pulmonary effort is normal. No respiratory distress.     Breath sounds: Normal breath sounds. No wheezing.  Abdominal:     Hernia: A hernia is present. Hernia is present in the umbilical area.  Musculoskeletal:     Cervical back: Neck supple.  Skin:    General: Skin is warm and dry.  Neurological:     Mental Status: He is alert. Mental status is at baseline.  Psychiatric:        Mood and Affect: Mood normal.      The 10-year ASCVD risk score (Arnett DK, et al., 2019) is: 6.5%   Values used to  calculate the score:     Age: 78 years     Sex: Male     Is Non-Hispanic African American: No     Diabetic: No     Tobacco smoker: No     Systolic Blood Pressure: 169 mmHg     Is BP treated: Yes     HDL Cholesterol: 43.4 mg/dL     Total Cholesterol: 185 mg/dL      Assessment & Plan:   Problem List Items Addressed This Visit       Cardiovascular and Mediastinum   Essential hypertension    Swelling likely 2/2 amlodipine. Start hctz 25 mg. Stop amlodipine. Cont valsartan 160 mg. Labs in 1-2 weeks to check potassium. Update with home monitoring      Relevant Medications   valsartan (DIOVAN) 160 MG tablet   hydrochlorothiazide (HYDRODIURIL) 25 MG tablet   Other Relevant Orders   Comprehensive metabolic panel     Respiratory   Asthma    Controlled on Symbicort.  Refill provided continue as needed albuterol.      Relevant Medications  budesonide-formoterol (SYMBICORT) 160-4.5 MCG/ACT inhaler   albuterol (VENTOLIN HFA) 108 (90 Base) MCG/ACT inhaler     Digestive   CELIAC DISEASE - Primary    Encouraged adherence to celiac diet.       Relevant Orders   CBC with Differential   IBC + Ferritin     Musculoskeletal and Integument   Psoriasis    Using fluocinonide-emollient prn. Well controlled. Sees Dr. Evorn Gong        Other   ANEMIA, IRON DEFICIENCY    Taking twice daily iron supplement, may be contributing to his constipation.  We will check labs and consider trial off of this medication.      Relevant Orders   IBC + Ferritin   Hypersomnia with sleep apnea    Using CPAP. Doing well.       Hyperlipidemia    ASCVD 6.5 %. Work on [employer]. Check today      Relevant Medications   valsartan (DIOVAN) 160 MG tablet   hydrochlorothiazide (HYDRODIURIL) 25 MG tablet   Other Relevant Orders   Lipid panel   Prediabetes    Encouraged weight loss.       Relevant Orders   Hemoglobin N7V   Umbilical hernia without obstruction and without gangrene    Discussed  options of surgery now vs watch and wait. He will watch and wait. Red flags and er precautions discussed if worsening. Encouraged weight loss.       Other Visit Diagnoses     Constipation, unspecified constipation type       Relevant Orders   TSH   Screening for prostate cancer       Relevant Orders   PSA   Screening for colon cancer       Relevant Orders   Ambulatory referral to Gastroenterology        Return in about 1 year (around 02/10/2023) for physical .  Lesleigh Noe, MD

## 2022-02-09 NOTE — Assessment & Plan Note (Signed)
Taking twice daily iron supplement, may be contributing to his constipation.  We will check labs and consider trial off of this medication.

## 2022-02-09 NOTE — Patient Instructions (Addendum)
#  Referral I have placed a referral to a specialist for you. You should receive a phone call from the specialty office. Make sure your voicemail is not full and that if you are able to answer your phone to unknown or new numbers.   It may take up to 2 weeks to hear about the referral. If you do not hear anything in 2 weeks, please call our office and ask to speak with the referral coordinator.     Hypertension - Stop amlodipine and combination pill - Start Valsartan 160 mg - start Hydrochlorothiazide 25 mg - return for fasting lasts in 1-2 week - check blood pressure at home and update with home monitoring in 1-2 weeks    Constipation -- follow celiac diet  Constipation is a common issue. Often it is related to diet and occasionally medications.    What you can do to treat your symptoms 1) Fiber -- Eat more fiber rich foods: beans, broccoli, berries, avocados, popcorn, pear/apple, green peas, turnip greens, brussels sprouts, whole grains (barley, bran, quinoa, oatmeal) -- Take a Fiber supplement: Psyllium (Metamucil)  -- Could also eat Prunes daily  2) Hydration  -- Drink more water: Try to drink 64 oz of water per day  3) Exercise -- Moderate exercise (walking, jogging, biking) for 30 minutes, 5 days a week  4) Dedicate time for Bowel movements - do not delay  5) Stool Softener  - Docusate Sodium (Colace) 100 mg daily or twice daily as needed   If 4-6 weeks have passed and the above has not helped then start the following 6) Laxatives -- Polyethylene Glycol (Miralax) - begin with once daily. After a few days can increase to twice daily Or -- Magnesium Citrate -- Common side effect is nausea and diarrhea -- can try if still not improved  If still not improved after 4-6 weeks 7) Stimulant Laxatives: can also be an option if doing above treatment and no bowel movement for 3 days --- Bisacodyl (Dulcolax) 5-15 mg daily --- Sennosides (Senokot)  Treating chronic  constipation is often about finding the right amount of medication and fiber to keep you regular and comfortable. For some people that may be daily metamucil and colace every other day. For others it may be Metamucil and colace twice daily and Miralax 3 times a week. The goal is to go slow and listen to your body. And normal can be anywhere from 2-3 soft bowel movements a day to 1 bowel movement every 2-3 days.

## 2022-02-09 NOTE — Assessment & Plan Note (Signed)
Swelling likely 2/2 amlodipine. Start hctz 25 mg. Stop amlodipine. Cont valsartan 160 mg. Labs in 1-2 weeks to check potassium. Update with home monitoring

## 2022-02-09 NOTE — Assessment & Plan Note (Signed)
Using fluocinonide-emollient prn. Well controlled. Sees Dr. Evorn Gong

## 2022-02-09 NOTE — Assessment & Plan Note (Signed)
Using CPAP. Doing well.

## 2022-02-09 NOTE — Assessment & Plan Note (Addendum)
Discussed options of surgery now vs watch and wait. He will watch and wait. Red flags and er precautions discussed if worsening. Encouraged weight loss.

## 2022-02-09 NOTE — Assessment & Plan Note (Signed)
Encouraged weight loss 

## 2022-02-09 NOTE — Assessment & Plan Note (Signed)
Controlled on Symbicort.  Refill provided continue as needed albuterol.

## 2022-02-09 NOTE — Assessment & Plan Note (Signed)
ASCVD 6.5 %. Work on [employer]. Check today

## 2022-02-14 ENCOUNTER — Telehealth: Payer: Self-pay

## 2022-02-14 NOTE — Telephone Encounter (Signed)
Returned patients call to schedule colonoscopy.  LVM for him to call back.  Thanks, [employer]

## 2022-02-14 NOTE — Telephone Encounter (Signed)
CALLED PATIENT NO ANSWER LEFT VOICEMAIL FOR A CALL BACK °Letter sent °

## 2022-02-23 ENCOUNTER — Other Ambulatory Visit (INDEPENDENT_AMBULATORY_CARE_PROVIDER_SITE_OTHER): Payer: BC Managed Care – PPO

## 2022-02-23 ENCOUNTER — Encounter: Payer: Self-pay | Admitting: Family Medicine

## 2022-02-23 DIAGNOSIS — K900 Celiac disease: Secondary | ICD-10-CM | POA: Diagnosis not present

## 2022-02-23 DIAGNOSIS — I10 Essential (primary) hypertension: Secondary | ICD-10-CM

## 2022-02-23 DIAGNOSIS — E782 Mixed hyperlipidemia: Secondary | ICD-10-CM

## 2022-02-23 DIAGNOSIS — K5900 Constipation, unspecified: Secondary | ICD-10-CM

## 2022-02-23 DIAGNOSIS — R7303 Prediabetes: Secondary | ICD-10-CM | POA: Diagnosis not present

## 2022-02-23 DIAGNOSIS — Z125 Encounter for screening for malignant neoplasm of prostate: Secondary | ICD-10-CM

## 2022-02-23 DIAGNOSIS — D509 Iron deficiency anemia, unspecified: Secondary | ICD-10-CM | POA: Diagnosis not present

## 2022-02-23 LAB — CBC WITH DIFFERENTIAL/PLATELET
Basophils Absolute: 0.1 10*3/uL (ref 0.0–0.1)
Basophils Relative: 0.9 % (ref 0.0–3.0)
Eosinophils Absolute: 0.1 10*3/uL (ref 0.0–0.7)
Eosinophils Relative: 1.4 % (ref 0.0–5.0)
HCT: 39.6 % (ref 39.0–52.0)
Hemoglobin: 12.7 g/dL — ABNORMAL LOW (ref 13.0–17.0)
Lymphocytes Relative: 12.4 % (ref 12.0–46.0)
Lymphs Abs: 0.8 10*3/uL (ref 0.7–4.0)
MCHC: 32.2 g/dL (ref 30.0–36.0)
MCV: 90.2 fl (ref 78.0–100.0)
Monocytes Absolute: 0.6 10*3/uL (ref 0.1–1.0)
Monocytes Relative: 9.8 % (ref 3.0–12.0)
Neutro Abs: 5 10*3/uL (ref 1.4–7.7)
Neutrophils Relative %: 75.5 % (ref 43.0–77.0)
Platelets: 286 10*3/uL (ref 150.0–400.0)
RBC: 4.39 Mil/uL (ref 4.22–5.81)
RDW: 13.4 % (ref 11.5–15.5)
WBC: 6.6 10*3/uL (ref 4.0–10.5)

## 2022-02-23 LAB — COMPREHENSIVE METABOLIC PANEL
ALT: 20 U/L (ref 0–53)
AST: 16 U/L (ref 0–37)
Albumin: 4.5 g/dL (ref 3.5–5.2)
Alkaline Phosphatase: 55 U/L (ref 39–117)
BUN: 16 mg/dL (ref 6–23)
CO2: 29 mEq/L (ref 19–32)
Calcium: 9.1 mg/dL (ref 8.4–10.5)
Chloride: 102 mEq/L (ref 96–112)
Creatinine, Ser: 1 mg/dL (ref 0.40–1.50)
GFR: 85.45 mL/min (ref >60.00)
Glucose, Bld: 103 mg/dL — ABNORMAL HIGH (ref 70–99)
Potassium: 4.3 mEq/L (ref 3.5–5.1)
Sodium: 138 mEq/L (ref 135–145)
Total Bilirubin: 0.5 mg/dL (ref 0.2–1.2)
Total Protein: 6.8 g/dL (ref 6.0–8.3)

## 2022-02-23 LAB — IBC + FERRITIN
Ferritin: 18.9 ng/mL — ABNORMAL LOW (ref 22.0–322.0)
Iron: 51 ug/dL (ref 42–165)
Saturation Ratios: 10.1 % — ABNORMAL LOW (ref 20.0–50.0)
TIBC: 502.6 ug/dL — ABNORMAL HIGH (ref 250.0–450.0)
Transferrin: 359 mg/dL (ref 212.0–360.0)

## 2022-02-23 LAB — LIPID PANEL
Cholesterol: 170 mg/dL (ref 0–200)
HDL: 38.5 mg/dL — ABNORMAL LOW (ref >39.00)
LDL Cholesterol: 108 mg/dL — ABNORMAL HIGH (ref 0–99)
NonHDL: 131.06
Total CHOL/HDL Ratio: 4
Triglycerides: 115 mg/dL (ref 0.0–149.0)
VLDL: 23 mg/dL (ref 0.0–40.0)

## 2022-02-23 LAB — PSA: PSA: 0.21 ng/mL (ref 0.10–4.00)

## 2022-02-23 LAB — HEMOGLOBIN A1C: Hgb A1c MFr Bld: 5.5 % (ref 4.6–6.5)

## 2022-02-23 LAB — TSH: TSH: 2.25 u[IU]/mL (ref 0.35–5.50)

## 2022-02-24 ENCOUNTER — Telehealth: Payer: Self-pay

## 2022-02-24 ENCOUNTER — Other Ambulatory Visit: Payer: Self-pay

## 2022-02-24 DIAGNOSIS — Z1211 Encounter for screening for malignant neoplasm of colon: Secondary | ICD-10-CM

## 2022-02-24 MED ORDER — NA SULFATE-K SULFATE-MG SULF 17.5-3.13-1.6 GM/177ML PO SOLN: 1.0000 | Freq: Once | ORAL | 0 refills | Status: AC

## 2022-02-24 MED ORDER — VALSARTAN-HYDROCHLOROTHIAZIDE 320-25 MG PO TABS: 1.0000 | ORAL_TABLET | Freq: Every day | ORAL | 1 refills | Status: DC

## 2022-02-24 NOTE — Addendum Note (Signed)
Addended by: Lesleigh Noe on: 02/24/2022 09:47 AM   Modules accepted: Orders

## 2022-02-24 NOTE — Telephone Encounter (Signed)
Gastroenterology Pre-Procedure Review  Request Date: 05/19/22 Requesting Physician: Dr. Vicente Males  PATIENT REVIEW QUESTIONS: The patient responded to the following health history questions as indicated via mychart communications:    SCHEDULE INFORMATION: Date: 05/19/22 Time: TBD Location: [facility]   "The current medication list is accurate".    1.Are you having any GI issues?  "recently have had some constipation, never had a history of that before".  02/09/22 Office Visit with Dr. Einar Pheasant noted patient ha Celiac Disease encouraged adherence to celiac diet 2. Do you have a personal history of Polyps?  "not that I am aware"  3. Do you have a family history of Colon Cancer or Polyps? "not that I am aware"  4. Diabetes Mellitus? No  5. Joint replacements in the past 12 months? No  6. Major health problems in the past 3 months? No  7. Any artificial heart valves, MVP, or defibrillator? No   Patient confirms/reports the following medications:  Current Outpatient Medications  Medication Sig Dispense Refill   albuterol (VENTOLIN HFA) 108 (90 Base) MCG/ACT inhaler Inhale 1-2 puffs into the lungs every 6 (six) hours as needed for wheezing or shortness of breath. 8.5 g 1   budesonide-formoterol (SYMBICORT) 160-4.5 MCG/ACT inhaler INHALE 1 PUFF INTO THE LUNGS IN THE MORNING AND AT BEDTIME. RINSE MOUTH AFTER EACH USE 10.2 each 5   cetirizine (ZYRTEC) 10 MG tablet Take 10 mg by mouth daily as needed.     ferrous gluconate (FERGON) 325 MG tablet Take 325 mg by mouth 2 (two) times daily.     fluocinonide-emollient (LIDEX-E) 0.05 % cream APPLY TO AFFECTED AREA TWICE A DAY 60 g 0   fluticasone (FLONASE) 50 MCG/ACT nasal spray Place 1 spray into both nostrils daily.     valsartan-hydrochlorothiazide (DIOVAN-HCT) 320-25 MG tablet Take 1 tablet by mouth daily. 90 tablet 1   No current facility-administered medications for this visit.    Patient confirms/reports the following allergies:  No Known  Allergies  No orders of the defined types were placed in this encounter.   AUTHORIZATION INFORMATION Primary Insurance: 1D#: Group #:  Secondary Insurance: 1D#: Group #:

## 2022-03-03 ENCOUNTER — Other Ambulatory Visit: Payer: Self-pay | Admitting: Family Medicine

## 2022-03-03 DIAGNOSIS — I10 Essential (primary) hypertension: Secondary | ICD-10-CM

## 2022-03-25 ENCOUNTER — Other Ambulatory Visit: Payer: Self-pay | Admitting: Family Medicine

## 2022-03-25 DIAGNOSIS — J4540 Moderate persistent asthma, uncomplicated: Secondary | ICD-10-CM

## 2022-04-12 ENCOUNTER — Ambulatory Visit: Payer: BC Managed Care – PPO | Admitting: Family Medicine

## 2022-04-12 VITALS — BP 150/100 | HR 81 | Temp 97.7°F | Ht 73.0 in | Wt 346.0 lb

## 2022-04-12 DIAGNOSIS — I10 Essential (primary) hypertension: Secondary | ICD-10-CM

## 2022-04-12 DIAGNOSIS — Z6841 Body Mass Index (BMI) 40.0 and over, adult: Secondary | ICD-10-CM

## 2022-04-12 MED ORDER — CARVEDILOL 6.25 MG PO TABS: 6.2500 mg | ORAL_TABLET | Freq: Two times a day (BID) | ORAL | 1 refills | Status: DC

## 2022-04-12 NOTE — Progress Notes (Signed)
   Subjective:     Benjamin Tapia is a 56 y.o. male presenting for Hypertension     HPI  #HTN - has been checking at home - no cp, sob, vision changes - gets occasional HA - amlodipine was stopped due to swelling - taking valsartan-hctz -    #obesity - has successfully lost weight in the past - hard to find motivation - just needs to stay active - cooks in most of the time - portion control is a big issue and challenge for him - pasta is hard - tries to limits rice and potatoes   Review of Systems   Social History   Tobacco Use  Smoking Status Never  Smokeless Tobacco Never        Objective:    BP Readings from Last 3 Encounters:  04/12/22 (!) 150/100  02/09/22 132/78  03/30/21 132/88   Wt Readings from Last 3 Encounters:  04/12/22 (!) 346 lb (156.9 kg)  02/09/22 (!) 344 lb 9 oz (156.3 kg)  03/30/21 (!) 336 lb (152.4 kg)    BP (!) 150/100   Pulse 81   Temp 97.7 F (36.5 C) (Temporal)   Ht 6' 1"  (1.854 m)   Wt (!) 346 lb (156.9 kg)   SpO2 97%   BMI 45.65 kg/m    Physical Exam Constitutional:      Appearance: Normal appearance. He is obese. He is not ill-appearing or diaphoretic.  HENT:     Right Ear: External ear normal.     Left Ear: External ear normal.     Nose: Nose normal.  Eyes:     General: No scleral icterus.    Extraocular Movements: Extraocular movements intact.     Conjunctiva/sclera: Conjunctivae normal.  Cardiovascular:     Rate and Rhythm: Normal rate and regular rhythm.     Heart sounds: No murmur heard. Pulmonary:     Effort: Pulmonary effort is normal. No respiratory distress.     Breath sounds: Normal breath sounds. No wheezing.  Musculoskeletal:     Cervical back: Neck supple.  Skin:    General: Skin is warm and dry.  Neurological:     Mental Status: He is alert. Mental status is at baseline.  Psychiatric:        Mood and Affect: Mood normal.           Assessment & Plan:   Problem List Items Addressed  This Visit       Cardiovascular and Mediastinum   Essential hypertension - Primary    BP elevated.  Intolerant of amlodipine due to swelling.  Start carvedilol 6.25 mg twice daily. Continue valsartan-hydrochlorothiazide 320-73m. Update with home readings in 2 weeks.       Relevant Medications   carvedilol (COREG) 6.25 MG tablet     Other   BMI 45.0-49.9, adult ([facility]    Pt aware he needs to lose weight. Hoping with swelling improved that he will be more motivated as legs less heavy.  Formation for healthy weight and wellness provided.  Update if he would like referral.  Discussed importance of calorie restriction and regular exercise.  Anticipate if he could lose 10% of his body weight he may not need 3 medications.        Return in about 6 weeks (around 05/24/2022) for bp check.  JLesleigh Noe MD

## 2022-04-12 NOTE — Assessment & Plan Note (Signed)
Pt aware he needs to lose weight. Hoping with swelling improved that he will be more motivated as legs less heavy.  Formation for healthy weight and wellness provided.  Update if he would like referral.  Discussed importance of calorie restriction and regular exercise.  Anticipate if he could lose 10% of his body weight he may not need 3 medications.

## 2022-04-12 NOTE — Assessment & Plan Note (Signed)
BP elevated.  Intolerant of amlodipine due to swelling.  Start carvedilol 6.25 mg twice daily. Continue valsartan-hydrochlorothiazide 320-18m. Update with home readings in 2 weeks.

## 2022-04-12 NOTE — Patient Instructions (Addendum)
[email or web address]   Your blood pressure high.   High blood pressure increases your risk for heart attack and stroke.    Please check your blood pressure 2-4 times a week.   To check your blood pressure 1) Sit in a quiet and relaxed place for 5 minutes 2) Make sure your feet are flat on the ground 3) Consider checking first thing in the morning   Normal blood pressure is less than 140/90 Ideally you blood pressure should be around 120/80  Other ways you can reduce your blood pressure:  1) Regular exercise -- Try to get 150 minutes (30 minutes, 5 days a week) of moderate to vigorous aerobic excercise -- Examples: brisk walking (2.5 miles per hour), water aerobics, dancing, gardening, tennis, biking slower than 10 miles per hour 2) DASH Diet - low fat meats, more fresh fruits and vegetables, whole grains, low salt 3) Quit smoking if you smoke 4) Loose 5-10% of your body weight

## 2022-05-02 ENCOUNTER — Other Ambulatory Visit: Payer: Self-pay | Admitting: Family Medicine

## 2022-05-02 DIAGNOSIS — J4540 Moderate persistent asthma, uncomplicated: Secondary | ICD-10-CM

## 2022-05-04 ENCOUNTER — Other Ambulatory Visit: Payer: Self-pay | Admitting: Family Medicine

## 2022-05-04 DIAGNOSIS — I10 Essential (primary) hypertension: Secondary | ICD-10-CM

## 2022-05-16 MED ORDER — SPIRONOLACTONE 25 MG PO TABS: 25.0000 mg | ORAL_TABLET | Freq: Every day | ORAL | 1 refills | Status: DC

## 2022-05-16 MED ORDER — VALSARTAN-HYDROCHLOROTHIAZIDE 320-25 MG PO TABS: 1.0000 | ORAL_TABLET | Freq: Every day | ORAL | 1 refills | Status: DC

## 2022-05-16 NOTE — Addendum Note (Signed)
Addended by: Lesleigh Noe on: 05/16/2022 07:52 AM   Modules accepted: Orders

## 2022-05-19 ENCOUNTER — Ambulatory Visit: Payer: BC Managed Care – PPO | Admitting: Anesthesiology

## 2022-05-19 ENCOUNTER — Encounter: Payer: Self-pay | Admitting: Gastroenterology

## 2022-05-19 ENCOUNTER — Encounter
Admission: RE | Disposition: A | Discharge status: Home / Self Care | Payer: Self-pay | Source: Home / Self Care | Attending: Gastroenterology

## 2022-05-19 ENCOUNTER — Ambulatory Visit
Admission: RE | Admit: 2022-05-19 | Discharge: 2022-05-19 | Disposition: A | Discharge status: Home / Self Care | Payer: BC Managed Care – PPO | Attending: Gastroenterology | Admitting: Gastroenterology

## 2022-05-19 DIAGNOSIS — J45909 Unspecified asthma, uncomplicated: Secondary | ICD-10-CM | POA: Diagnosis not present

## 2022-05-19 DIAGNOSIS — Z6841 Body Mass Index (BMI) 40.0 and over, adult: Secondary | ICD-10-CM | POA: Insufficient documentation

## 2022-05-19 DIAGNOSIS — Z1211 Encounter for screening for malignant neoplasm of colon: Secondary | ICD-10-CM

## 2022-05-19 DIAGNOSIS — I10 Essential (primary) hypertension: Secondary | ICD-10-CM | POA: Insufficient documentation

## 2022-05-19 DIAGNOSIS — R7303 Prediabetes: Secondary | ICD-10-CM | POA: Insufficient documentation

## 2022-05-19 DIAGNOSIS — G4730 Sleep apnea, unspecified: Secondary | ICD-10-CM | POA: Diagnosis not present

## 2022-05-19 DIAGNOSIS — K5730 Diverticulosis of large intestine without perforation or abscess without bleeding: Secondary | ICD-10-CM | POA: Insufficient documentation

## 2022-05-19 HISTORY — PX: COLONOSCOPY WITH PROPOFOL: SHX5780

## 2022-05-19 HISTORY — DX: Essential (primary) hypertension: I10

## 2022-05-19 SURGERY — COLONOSCOPY WITH PROPOFOL
Anesthesia: General

## 2022-05-19 MED ORDER — LIDOCAINE 2% (20 MG/ML) 5 ML SYRINGE
INTRAMUSCULAR | Status: DC | PRN
  Administered 2022-05-19: 50 mg via INTRAVENOUS

## 2022-05-19 MED ORDER — PROPOFOL 10 MG/ML IV BOLUS
INTRAVENOUS | Status: DC | PRN
  Administered 2022-05-19: 100 mg via INTRAVENOUS

## 2022-05-19 MED ORDER — PROPOFOL 1000 MG/100ML IV EMUL
INTRAVENOUS | Status: AC
  Filled 2022-05-19: qty 100

## 2022-05-19 MED ORDER — SODIUM CHLORIDE 0.9 % IV SOLN: INTRAVENOUS | Status: DC

## 2022-05-19 MED ORDER — PROPOFOL 500 MG/50ML IV EMUL
INTRAVENOUS | Status: DC | PRN
  Administered 2022-05-19: 150 ug/kg/min via INTRAVENOUS

## 2022-05-19 MED ORDER — LIDOCAINE HCL (PF) 2 % IJ SOLN
INTRAMUSCULAR | Status: AC
  Filled 2022-05-19: qty 5

## 2022-05-19 NOTE — Anesthesia Preprocedure Evaluation (Addendum)
Anesthesia Evaluation  Patient identified by MRN, date of birth, ID band Patient awake    Reviewed: Allergy & Precautions, NPO status , Patient's Chart, lab work & pertinent test results  History of Anesthesia Complications Negative for: history of anesthetic complications  Airway Mallampati: III   Neck ROM: Full    Dental no notable dental hx.    Pulmonary asthma , sleep apnea and Continuous Positive Airway Pressure Ventilation ,    Pulmonary exam normal breath sounds clear to auscultation       Cardiovascular hypertension, Normal cardiovascular exam Rhythm:Regular Rate:Normal     Neuro/Psych negative neurological ROS     GI/Hepatic negative GI ROS,   Endo/Other  Prediabetes; class 3 obesity  Renal/GU negative Renal ROS     Musculoskeletal   Abdominal   Peds  Hematology  (+) Blood dyscrasia, anemia ,   Anesthesia Other Findings   Reproductive/Obstetrics                            Anesthesia Physical Anesthesia Plan  ASA: 3  Anesthesia Plan: General   Post-op Pain Management:    Induction: Intravenous  PONV Risk Score and Plan: 2 and Propofol infusion, TIVA and Treatment may vary due to age or medical condition  Airway Management Planned: Natural Airway  Additional Equipment:   Intra-op Plan:   Post-operative Plan:   Informed Consent: I have reviewed the patients History and Physical, chart, labs and discussed the procedure including the risks, benefits and alternatives for the proposed anesthesia with the patient or authorized representative who has indicated his/her understanding and acceptance.       Plan Discussed with: CRNA  Anesthesia Plan Comments: (LMA/GETA backup discussed.  Patient consented for risks of anesthesia including but not limited to:  - adverse reactions to medications - damage to eyes, teeth, lips or other oral mucosa - nerve damage due to  positioning  - sore throat or hoarseness - damage to heart, brain, nerves, lungs, other parts of body or loss of life  Informed patient about role of CRNA in peri- and intra-operative care.  Patient voiced understanding.)       Anesthesia Quick Evaluation

## 2022-05-19 NOTE — H&P (Signed)
Jonathon Bellows, MD [address], [place], [place], [place], [address] [address], [place], [place], [place], [address] Phone: [phone]  Fax: [phone]  Primary Care Physician:  Lesleigh Noe, MD   Pre-Procedure History & Physical: HPI:  Benjamin Tapia is a 56 y.o. male is here for an colonoscopy.   Past Medical History:  Diagnosis Date   ALLERGIC RHINITIS    Asthma    Celiac sprue    Hypertension    Sleep apnea     Past Surgical History:  Procedure Laterality Date   TONSILLECTOMY      Prior to Admission medications   Medication Sig Start Date End Date Taking? Authorizing Provider  albuterol (VENTOLIN HFA) 108 (90 Base) MCG/ACT inhaler INHALE 1-2 PUFFS BY MOUTH EVERY 6 HOURS AS NEEDED FOR WHEEZE OR SHORTNESS OF BREATH 05/04/22  Yes Lesleigh Noe, MD  aspirin EC 81 MG tablet Take 81 mg by mouth daily. Swallow whole.   Yes [provider]  budesonide-formoterol (SYMBICORT) 160-4.5 MCG/ACT inhaler INHALE 1 PUFF INTO THE LUNGS IN THE MORNING AND AT BEDTIME. RINSE MOUTH AFTER EACH USE 02/09/22  Yes Lesleigh Noe, MD  carvedilol (COREG) 6.25 MG tablet TAKE 1 TABLET BY MOUTH 2 TIMES DAILY WITH A MEAL. 05/05/22  Yes Lesleigh Noe, MD  spironolactone (ALDACTONE) 25 MG tablet Take 1 tablet (25 mg total) by mouth daily. 05/16/22  Yes Lesleigh Noe, MD  valsartan-hydrochlorothiazide (DIOVAN-HCT) 320-25 MG tablet Take 1 tablet by mouth daily. 05/16/22  Yes Lesleigh Noe, MD  cetirizine (ZYRTEC) 10 MG tablet Take 10 mg by mouth daily as needed.    [provider]  ferrous gluconate (FERGON) 325 MG tablet Take 325 mg by mouth 2 (two) times daily.    [provider]  fluocinonide-emollient (LIDEX-E) 0.05 % cream APPLY TO AFFECTED AREA TWICE A DAY 03/09/21   Dutch Quint B, FNP  fluticasone (FLONASE) 50 MCG/ACT nasal spray Place 1 spray into both nostrils daily.    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 [place] Use   Smoking status: Never   Smokeless tobacco: Never  Vaping Use   Vaping Use: Never used  Substance and Sexual Activity   Alcohol use: Yes    Comment: social   Drug use: No   Sexual activity: Yes  Other Topics Concern   Not on file  Social History Narrative   Regular exercise - no   2 daughters 63 and 31 in [place] Determinants of Health   Financial Resource Strain: Not on file  Food Insecurity: Not on file  Transportation Needs: Not on file  Physical Activity: Not on file  Stress: Not on file  Social Connections: Not on  file  Intimate Partner Violence: Not on file    Review of Systems: See HPI, otherwise negative ROS  Physical Exam: BP (!) 105/105   Pulse 81   Temp (!) 96.8 F (36 C) (Temporal)   Resp 18   Ht 6' 1"  (1.854 m)   Wt (!) 158.8 kg   SpO2 99%   BMI 46.18 kg/m  General:   Alert,  pleasant and cooperative in NAD Head:  Normocephalic and atraumatic. Neck:  Supple; no masses or thyromegaly. Lungs:  Clear throughout to auscultation, normal respiratory effort.    Heart:  +S1, +S2, Regular rate and rhythm, No edema. Abdomen:  Soft, nontender and nondistended. Normal bowel sounds, without guarding, and without rebound.   Neurologic:  Alert and  oriented x4;  grossly normal neurologically.  Impression/Plan: Benjamin Tapia is here for an colonoscopy to be performed for Screening colonoscopy average risk    Risks, benefits, limitations, and alternatives regarding  colonoscopy have been reviewed with the patient.  Questions have been answered.  All parties agreeable.   Jonathon Bellows, MD  05/19/2022, 7:50 AM

## 2022-05-19 NOTE — Op Note (Signed)
[facility] Gastroenterology Patient Name: Benjamin Tapia Procedure Date: 05/19/2022 7:50 AM MRN: [id number] Account #: [id number] Date of Birth: 1966-02-28 Admit Type: Outpatient Age: 56 Room: [facility] ENDO ROOM 4 Gender: Male Note Status: Finalized Instrument Name: Park Meo [id number] Procedure:             Colonoscopy Indications:           Screening for colorectal malignant neoplasm Providers:             Jonathon Bellows MD, MD Referring MD:          Jobe Marker. Einar Pheasant (Referring MD) Medicines:             Monitored Anesthesia Care Complications:         No immediate complications. Procedure:             Pre-Anesthesia Assessment:                        - Prior to the procedure, a History and Physical was                         performed, and patient medications, allergies and                         sensitivities were reviewed. The patient's tolerance                         of previous anesthesia was reviewed.                        - The risks and benefits of the procedure and the                         sedation options and risks were discussed with the                         patient. All questions were answered and informed                         consent was obtained.                        - ASA Grade Assessment: II - A patient with mild                         systemic disease.                        After obtaining informed consent, the colonoscope was                         passed under direct vision. Throughout the procedure,                         the patient's blood pressure, pulse, and oxygen                         saturations were monitored continuously. The                         Colonoscope was introduced  through the anus and                         advanced to the the cecum, identified by the                         appendiceal orifice. The colonoscopy was performed                         with ease. The patient tolerated the procedure well.                          The quality of the bowel preparation was excellent. Findings:      The perianal and digital rectal examinations were normal.      Multiple small and large-mouthed diverticula were found in the left       colon.      The exam was otherwise without abnormality.      The exam was otherwise without abnormality on direct and retroflexion       views. Impression:            - Diverticulosis in the left colon.                        - The examination was otherwise normal.                        - The examination was otherwise normal on direct and                         retroflexion views.                        - No specimens collected. Recommendation:        - Discharge patient to home (with escort).                        - Resume previous diet.                        - Continue present medications.                        - Repeat colonoscopy in 10 years for screening                         purposes. Procedure Code(s):     --- Professional ---                        [phone], Colonoscopy, flexible; diagnostic, including                         collection of specimen(s) by brushing or washing, when                         performed (separate procedure) Diagnosis Code(s):     --- Professional ---                        Z12.11, Encounter for screening for malignant neoplasm  of colon                        K57.30, Diverticulosis of large intestine without                         perforation or abscess without bleeding CPT copyright 2019 American Medical Association. All rights reserved. The codes documented in this report are preliminary and upon coder review may  be revised to meet current compliance requirements. Jonathon Bellows, MD Jonathon Bellows MD, MD 05/19/2022 8:06:54 AM This report has been signed electronically. Number of Addenda: 0 Note Initiated On: 05/19/2022 7:50 AM Scope Withdrawal Time: 0 hours 6 minutes 53 seconds  Total Procedure Duration: 0  hours 8 minutes 24 seconds  Estimated Blood Loss:  Estimated blood loss: none.      [facility]

## 2022-05-19 NOTE — Anesthesia Postprocedure Evaluation (Signed)
Anesthesia Post Note  Patient: Benjamin Tapia  Procedure(s) Performed: COLONOSCOPY WITH PROPOFOL  Patient location during evaluation: PACU Anesthesia Type: General Level of consciousness: awake and alert, oriented and patient cooperative Pain management: pain level controlled Vital Signs Assessment: post-procedure vital signs reviewed and stable Respiratory status: spontaneous breathing, nonlabored ventilation and respiratory function stable Cardiovascular status: blood pressure returned to baseline and stable Postop Assessment: adequate PO intake Anesthetic complications: no   No notable events documented.   Last Vitals:  Vitals:   05/19/22 0820 05/19/22 0830  BP: 115/77 123/81  Pulse: 73 68  Resp: 18 18  Temp:    SpO2: 98% 98%    Last Pain:  Vitals:   05/19/22 0830  TempSrc:   PainSc: 0-No pain                 Darrin Nipper

## 2022-05-19 NOTE — Transfer of Care (Signed)
Immediate Anesthesia Transfer of Care Note  Patient: Benjamin Tapia  Procedure(s) Performed: COLONOSCOPY WITH PROPOFOL  Patient Location: Endoscopy Unit  Anesthesia Type:General  Level of Consciousness: awake, alert  and oriented  Airway & Oxygen Therapy: Patient Spontanous Breathing and Patient connected to face mask oxygen  Post-op Assessment: Report given to RN and Post -op Vital signs reviewed and stable  Post vital signs: Reviewed and stable  Last Vitals:  Vitals Value Taken Time  BP 87/55 05/19/22 0810  Temp 36.7 C 05/19/22 0810  Pulse 78 05/19/22 0810  Resp 19 05/19/22 0810  SpO2 95 % 05/19/22 0810  Vitals shown include unvalidated device data.  Last Pain:  Vitals:   05/19/22 0810  TempSrc: Temporal  PainSc: 0-No pain         Complications: No notable events documented.

## 2022-05-22 ENCOUNTER — Encounter: Payer: Self-pay | Admitting: Gastroenterology

## 2022-06-02 ENCOUNTER — Other Ambulatory Visit (INDEPENDENT_AMBULATORY_CARE_PROVIDER_SITE_OTHER): Payer: BC Managed Care – PPO

## 2022-06-02 DIAGNOSIS — I10 Essential (primary) hypertension: Secondary | ICD-10-CM

## 2022-06-02 NOTE — Addendum Note (Signed)
Addended by: Ellamae Sia on: 06/02/2022 07:56 AM   Modules accepted: Orders

## 2022-06-03 LAB — BASIC METABOLIC PANEL
BUN/Creatinine Ratio: 13 (ref 9–20)
BUN: 14 mg/dL (ref 6–24)
CO2: 25 mmol/L (ref 20–29)
Calcium: 9.3 mg/dL (ref 8.7–10.2)
Chloride: 100 mmol/L (ref 96–106)
Creatinine, Ser: 1.04 mg/dL (ref 0.76–1.27)
Glucose: 116 mg/dL — ABNORMAL HIGH (ref 70–99)
Potassium: 4.5 mmol/L (ref 3.5–5.2)
Sodium: 140 mmol/L (ref 134–144)
eGFR: 85 mL/min/{1.73_m2} (ref >59)

## 2022-06-19 ENCOUNTER — Other Ambulatory Visit: Payer: Self-pay

## 2022-06-19 DIAGNOSIS — J4540 Moderate persistent asthma, uncomplicated: Secondary | ICD-10-CM

## 2022-06-19 MED ORDER — ALBUTEROL SULFATE HFA 108 (90 BASE) MCG/ACT IN AERS: INHALATION_SPRAY | RESPIRATORY_TRACT | 1 refills | Status: DC

## 2022-06-19 NOTE — Telephone Encounter (Signed)
Pt needs a BP check appt with any provider until he can get a TOC scheduled. The BP check is more important right now.

## 2022-06-19 NOTE — Telephone Encounter (Signed)
LVM for patient to call back and schedule

## 2022-07-11 ENCOUNTER — Telehealth: Payer: Self-pay

## 2022-07-11 NOTE — Telephone Encounter (Signed)
Received refill request from pharmacy last note does not mention patient being on Spironolactone for HTN. Patient does have toc with [facility] on 09/05/2022

## 2022-07-12 ENCOUNTER — Other Ambulatory Visit: Payer: Self-pay | Admitting: Family

## 2022-07-12 DIAGNOSIS — I10 Essential (primary) hypertension: Secondary | ICD-10-CM

## 2022-07-12 MED ORDER — SPIRONOLACTONE 25 MG PO TABS: 25.0000 mg | ORAL_TABLET | Freq: Every day | ORAL | 1 refills | Status: DC

## 2022-08-04 ENCOUNTER — Other Ambulatory Visit: Payer: Self-pay | Admitting: Family

## 2022-08-04 DIAGNOSIS — I10 Essential (primary) hypertension: Secondary | ICD-10-CM

## 2022-09-05 ENCOUNTER — Encounter: Payer: BC Managed Care – PPO | Admitting: Family

## 2022-09-13 ENCOUNTER — Other Ambulatory Visit: Payer: Self-pay | Admitting: Family

## 2022-09-13 DIAGNOSIS — J4540 Moderate persistent asthma, uncomplicated: Secondary | ICD-10-CM

## 2022-09-18 ENCOUNTER — Other Ambulatory Visit: Payer: Self-pay

## 2022-09-18 ENCOUNTER — Telehealth: Payer: Self-pay | Admitting: Family Medicine

## 2022-09-18 DIAGNOSIS — I10 Essential (primary) hypertension: Secondary | ICD-10-CM

## 2022-09-18 MED ORDER — SPIRONOLACTONE 25 MG PO TABS: 25.0000 mg | ORAL_TABLET | Freq: Every day | ORAL | 1 refills | Status: DC

## 2022-09-18 NOTE — Telephone Encounter (Signed)
Prescription Request  09/18/2022  Is this a "Controlled Substance" medicine? No  LOV: 04/12/2022  What is the name of the medication or equipment? spironolactone (ALDACTONE) 25 MG tablet   Have you contacted your pharmacy to request a refill? Yes   Which pharmacy would you like this sent to?  [id number] [place] [address] [place] [address] Phone: [phone] Fax: [phone]    Patient notified that their request is being sent to the clinical staff for review and that they should receive a response within 2 business days.   Please advise at Mobile [phone] (mobile)  Comment: Patient is completely out of the medication.

## 2022-09-18 NOTE — Telephone Encounter (Signed)
Last visit 04/12/2022 Next  TOC with [facility] 10/13/2022

## 2022-09-18 NOTE — Telephone Encounter (Signed)
Rx request sent to [facility]. 

## 2022-10-13 ENCOUNTER — Encounter: Payer: Self-pay | Admitting: Family

## 2022-10-13 ENCOUNTER — Ambulatory Visit: Payer: BC Managed Care – PPO | Admitting: Family

## 2022-10-13 VITALS — BP 145/95 | HR 88 | Temp 98.1°F | Ht 73.0 in | Wt 346.6 lb

## 2022-10-13 DIAGNOSIS — R739 Hyperglycemia, unspecified: Secondary | ICD-10-CM | POA: Insufficient documentation

## 2022-10-13 DIAGNOSIS — I10 Essential (primary) hypertension: Secondary | ICD-10-CM

## 2022-10-13 DIAGNOSIS — K900 Celiac disease: Secondary | ICD-10-CM

## 2022-10-13 DIAGNOSIS — R7303 Prediabetes: Secondary | ICD-10-CM

## 2022-10-13 DIAGNOSIS — Z6841 Body Mass Index (BMI) 40.0 and over, adult: Secondary | ICD-10-CM | POA: Insufficient documentation

## 2022-10-13 DIAGNOSIS — J4520 Mild intermittent asthma, uncomplicated: Secondary | ICD-10-CM | POA: Diagnosis not present

## 2022-10-13 DIAGNOSIS — K5790 Diverticulosis of intestine, part unspecified, without perforation or abscess without bleeding: Secondary | ICD-10-CM | POA: Diagnosis not present

## 2022-10-13 DIAGNOSIS — K420 Umbilical hernia with obstruction, without gangrene: Secondary | ICD-10-CM

## 2022-10-13 DIAGNOSIS — Z832 Family history of diseases of the blood and blood-forming organs and certain disorders involving the immune mechanism: Secondary | ICD-10-CM

## 2022-10-13 DIAGNOSIS — D509 Iron deficiency anemia, unspecified: Secondary | ICD-10-CM

## 2022-10-13 NOTE — Patient Instructions (Signed)
  Welcome to our clinic, I am happy to have you as my new patient. I am excited to continue on this healthcare journey with you.  Stop by the lab prior to leaving today. I will notify you of your results once received.   Please keep in mind Any my chart messages you send have up to a three business day turnaround for a response.  Phone calls may take up to a one full business day turnaround for a  response.   If you need a medication refill I recommend you request it through the pharmacy as this is easiest for us rather than sending a message and or phone call.   Due to recent changes in healthcare laws, you may see results of your imaging and/or laboratory studies on MyChart before I have had a chance to review them.  I understand that in some cases there may be results that are confusing or concerning to you. Please understand that not all results are received at the same time and often I may need to interpret multiple results in order to provide you with the best plan of care or course of treatment. Therefore, I ask that you please give me 2 business days to thoroughly review all your results before contacting my office for clarification. Should we see a critical lab result, you will be contacted sooner.   It was a pleasure seeing you today! Please do not hesitate to reach out with any questions and or concerns.  Regards,   Soha Thorup FNP-C  

## 2022-10-13 NOTE — Assessment & Plan Note (Signed)
Advised pt he is 'allergic' to gluten and care should be taken to avoid gluten products. Pt verbalized understanding.  Can consider celiac nutritionist in future if needed.  Likely cause of IDA.

## 2022-10-13 NOTE — Assessment & Plan Note (Addendum)
Continue medications as prescribed, suspected this is white coat as at home blood pressure readings wnl   Pt advised of the following:  Continue medication as prescribed. Monitor blood pressure periodically and/or when you feel symptomatic. Goal is <130/90 on average. Ensure that you have rested for 30 minutes prior to checking your blood pressure. Record your readings and bring them to your next visit if necessary.work on a low sodium diet. Urine microalbumin ordered for today.

## 2022-10-13 NOTE — Progress Notes (Signed)
Established Patient Office Visit  Subjective:  Patient ID: Benjamin Tapia, male    DOB: 1966-05-08  Age: 57 y.o. MRN: [id number]  CC:  Chief Complaint  Patient presents with   Transitions Of Care    HPI Benjamin Tapia is here for a transition of care visit.  Prior provider was: Dr. Waunita Schooner    Pt is with acute concerns.  Umbilical hernia, worsening as of lately, fridge broke back on thanksgiving and lifted it and felt irritation a bit after that for about a week. Not hurting today at time tingles.   chronic concerns:  HTN: on diovan hct 320/25 mg once daily and started on spironolactone. Checks blood pressure at home every am and  Average seems to be 120/80 or so so   IDA: ferrous gluconate twice daily.   Allergic rhinitis: on zyrtec and flonase, doing well. Controlled per pt.   Asthma, mild intermittent, maybe uses albuterol once or twice every two weeks. Dogs are aggravating triggers or not well ventilated area   Celiac disease: pt does still eating gluten. States he tries to limit this but not completely.   Sleep apnea: on cpap currently.   Obesity: does admit needs to start exercising and to focus a bit more on diet. Pt is aware of this.   Past Medical History:  Diagnosis Date   ALLERGIC RHINITIS    Asthma    Celiac sprue    Hypertension    Sleep apnea     Past Surgical History:  Procedure Laterality Date   COLONOSCOPY WITH PROPOFOL N/A 05/19/2022   Procedure: COLONOSCOPY WITH PROPOFOL;  Surgeon: Jonathon Bellows, MD;  Location: [facility] ENDOSCOPY;  Service: Gastroenterology;  Laterality: N/A;   TONSILLECTOMY      Family History  Problem Relation Age of Onset   Hypertension Mother    Autoimmune disease Mother    Hypertension Father    Hyperlipidemia Father    Factor V Leiden deficiency Father 73       DVT and PE   Diverticulosis Father    Interstitial cystitis Brother        some lung autoimmune disease   Leukemia Maternal Grandfather    Stroke Paternal  Grandfather    Diabetes type I Daughter     Social History   Socioeconomic History   Marital status: Married    Spouse name: Not on file   Number of children: 2   Years of education: Not on file   Highest education level: Not on file  Occupational History   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 [place] Use   Smoking status: Never   Smokeless tobacco: Never  Vaping Use   Vaping Use: Never used  Substance and Sexual Activity   Alcohol use: Yes    Comment: social   Drug use: No   Sexual activity: Yes    Partners: Female    Birth control/protection: Post-menopausal  Other Topics Concern   Not on file  Social History Narrative   Regular exercise - no   2 daughters 67 and 69 in [place] Determinants of Health   Financial Resource Strain: Not on file  Food Insecurity: Not on file  Transportation Needs: Not on file  Physical Activity: Not on file  Stress: Not on file  Social Connections: Not on file  Intimate Partner Violence: Not on file    Outpatient Medications Prior to Visit  Medication Sig Dispense Refill   albuterol (VENTOLIN  HFA) 108 (90 Base) MCG/ACT inhaler INHALE 1-2 PUFFS BY MOUTH EVERY 6 HOURS AS NEEDED FOR WHEEZE OR SHORTNESS OF BREATH 18 each 1   aspirin EC 81 MG tablet Take 81 mg by mouth daily. Swallow whole.     budesonide-formoterol (SYMBICORT) 160-4.5 MCG/ACT inhaler INHALE 1 PUFF INTO THE LUNGS IN THE MORNING AND AT BEDTIME. RINSE MOUTH AFTER EACH USE 10.2 each 5   ferrous gluconate (FERGON) 325 MG tablet Take 325 mg by mouth 2 (two) times daily.     fluocinonide-emollient (LIDEX-E) 0.05 % cream APPLY TO AFFECTED AREA TWICE A DAY 60 g 0   fluticasone (FLONASE) 50 MCG/ACT nasal spray Place 1 spray into both nostrils daily.     spironolactone (ALDACTONE) 25 MG tablet Take 1 tablet (25 mg total) by mouth daily. 30 tablet 1   valsartan-hydrochlorothiazide (DIOVAN-HCT) 320-25 MG tablet Take 1 tablet by mouth daily.  90 tablet 1   cetirizine (ZYRTEC) 10 MG tablet Take 10 mg by mouth daily as needed.     Cetirizine HCl (ZYRTEC ALLERGY PO) ZyrTEC     carvedilol (COREG) 6.25 MG tablet TAKE 1 TABLET BY MOUTH 2 TIMES DAILY WITH A MEAL. 180 tablet 3   No facility-administered medications prior to visit.    Allergies  Allergen Reactions   Amlodipine Swelling    ROS: Pertinent symptoms negative unless otherwise noted in HPI      Objective:    Physical Exam Constitutional:      Appearance: Normal appearance. He is obese.  HENT:     Head: Normocephalic.  Cardiovascular:     Rate and Rhythm: Normal rate and regular rhythm.  Pulmonary:     Effort: Pulmonary effort is normal.  Abdominal:     Tenderness: There is abdominal tenderness.     Hernia: A hernia is present. Hernia is present in the umbilical area (hard to reduce).  Musculoskeletal:        General: Normal range of motion.  Neurological:     General: No focal deficit present.     Mental Status: He is alert and oriented to person, place, and time. Mental status is at baseline.  Psychiatric:        Mood and Affect: Mood normal.        Behavior: Behavior normal.        Thought Content: Thought content normal.        Judgment: Judgment normal.      BP (!) 145/95   Pulse 88   Temp 98.1 F (36.7 C) (Temporal)   Ht 6' 1"$  (1.854 m)   Wt (!) 346 lb 9.6 oz (157.2 kg)   SpO2 98%   BMI 45.73 kg/m  Wt Readings from Last 3 Encounters:  10/13/22 (!) 346 lb 9.6 oz (157.2 kg)  05/19/22 (!) 350 lb (158.8 kg)  04/12/22 (!) 346 lb (156.9 kg)     There are no preventive care reminders to display for this patient.  There are no preventive care reminders to display for this patient.  Lab Results  Component Value Date   TSH 2.25 02/23/2022   Lab Results  Component Value Date   WBC 6.6 02/23/2022   HGB 12.7 (L) 02/23/2022   HCT 39.6 02/23/2022   MCV 90.2 02/23/2022   PLT 286.0 02/23/2022   Lab Results  Component Value Date   NA  140 06/02/2022   K 4.5 06/02/2022   CO2 25 06/02/2022   GLUCOSE 116 (H) 06/02/2022   BUN 14 06/02/2022  CREATININE 1.04 06/02/2022   BILITOT 0.5 02/23/2022   ALKPHOS 55 02/23/2022   AST 16 02/23/2022   ALT 20 02/23/2022   PROT 6.8 02/23/2022   ALBUMIN 4.5 02/23/2022   CALCIUM 9.3 06/02/2022   EGFR 85 06/02/2022   GFR 85.45 02/23/2022   Lab Results  Component Value Date   CHOL 170 02/23/2022   Lab Results  Component Value Date   HDL 38.50 (L) 02/23/2022   Lab Results  Component Value Date   LDLCALC 108 (H) 02/23/2022   Lab Results  Component Value Date   TRIG 115.0 02/23/2022   Lab Results  Component Value Date   CHOLHDL 4 02/23/2022   Lab Results  Component Value Date   HGBA1C 5.5 02/23/2022      Assessment & Plan:   Mild intermittent asthma without complication Assessment & Plan: Stable.   Orders: -     Basic metabolic panel -     CBC with Differential/Platelet -     Factor V Leiden -     Hemoglobin A1c -     Microalbumin / creatinine urine ratio -     Iron and TIBC -     Ferritin  Diverticulosis -     Basic metabolic panel -     CBC with Differential/Platelet -     Factor V Leiden -     Hemoglobin A1c -     Microalbumin / creatinine urine ratio -     Iron and TIBC -     Ferritin  Hyperglycemia -     Basic metabolic panel -     CBC with Differential/Platelet -     Factor V Leiden -     Hemoglobin A1c -     Microalbumin / creatinine urine ratio -     Iron and TIBC -     Ferritin  Prediabetes -     Basic metabolic panel -     CBC with Differential/Platelet -     Factor V Leiden -     Hemoglobin A1c -     Microalbumin / creatinine urine ratio -     Iron and TIBC -     Ferritin  Iron deficiency anemia, unspecified iron deficiency anemia type -     Basic metabolic panel -     CBC with Differential/Platelet -     Factor V Leiden -     Hemoglobin A1c -     Microalbumin / creatinine urine ratio -     Iron and TIBC -      Ferritin  Essential hypertension Assessment & Plan: Continue medications as prescribed, suspected this is white coat as at home blood pressure readings wnl   Pt advised of the following:  Continue medication as prescribed. Monitor blood pressure periodically and/or when you feel symptomatic. Goal is <130/90 on average. Ensure that you have rested for 30 minutes prior to checking your blood pressure. Record your readings and bring them to your next visit if necessary.work on a low sodium diet. Urine microalbumin ordered for today.    Orders: -     Basic metabolic panel -     CBC with Differential/Platelet -     Factor V Leiden -     Hemoglobin A1c -     Microalbumin / creatinine urine ratio -     Iron and TIBC -     Ferritin  Class 3 severe obesity due to excess calories with serious comorbidity and body mass index (  BMI) of 45.0 to 49.9 in adult [facility]) Assessment & Plan: Pt advised to work on diet and exercise as tolerated   Orders: -     Basic metabolic panel -     CBC with Differential/Platelet -     Factor V Leiden -     Hemoglobin A1c -     Microalbumin / creatinine urine ratio -     Iron and TIBC -     Ferritin  Family history of factor V deficiency -     Basic metabolic panel -     CBC with Differential/Platelet -     Factor V Leiden -     Hemoglobin A1c -     Microalbumin / creatinine urine ratio -     Iron and TIBC -     Ferritin  Umbilical hernia with obstruction, without gangrene Assessment & Plan: Painful often, hard to reduce Referral placed for urgent assessment by general surgery  Did advise pt of red flag symptoms  Advised to avoid straining and constipation as able Can wear abdominal binder as well.   Orders: -     Ambulatory referral to General Surgery -     Basic metabolic panel -     CBC with Differential/Platelet -     Factor V Leiden -     Hemoglobin A1c -     Microalbumin / creatinine urine ratio -     Iron and TIBC -      Ferritin  Celiac disease Assessment & Plan: Advised pt he is 'allergic' to gluten and care should be taken to avoid gluten products. Pt verbalized understanding.  Can consider celiac nutritionist in future if needed.  Likely cause of IDA.      No orders of the defined types were placed in this encounter.   Follow-up: Return in about 6 months (around 04/13/2023) for f/u blood pressure.    Eugenia Pancoast, FNP

## 2022-10-13 NOTE — Assessment & Plan Note (Signed)
Pt advised to work on diet and exercise as tolerated

## 2022-10-13 NOTE — Assessment & Plan Note (Signed)
Stable. 

## 2022-10-13 NOTE — Assessment & Plan Note (Signed)
Painful often, hard to reduce Referral placed for urgent assessment by general surgery  Did advise pt of red flag symptoms  Advised to avoid straining and constipation as able Can wear abdominal binder as well.

## 2022-10-15 LAB — CBC WITH DIFFERENTIAL/PLATELET
Basophils Absolute: 0.1 10*3/uL (ref 0.0–0.2)
Basos: 1 %
EOS (ABSOLUTE): 0.1 10*3/uL (ref 0.0–0.4)
Eos: 1 %
Hematocrit: 38.2 % (ref 37.5–51.0)
Hemoglobin: 12.6 g/dL — ABNORMAL LOW (ref 13.0–17.7)
Immature Grans (Abs): 0 10*3/uL (ref 0.0–0.1)
Immature Granulocytes: 0 %
Lymphocytes Absolute: 1.2 10*3/uL (ref 0.7–3.1)
Lymphs: 14 %
MCH: 29.9 pg (ref 26.6–33.0)
MCHC: 33 g/dL (ref 31.5–35.7)
MCV: 91 fL (ref 79–97)
Monocytes Absolute: 0.8 10*3/uL (ref 0.1–0.9)
Monocytes: 10 %
Neutrophils Absolute: 6.3 10*3/uL (ref 1.4–7.0)
Neutrophils: 74 %
Platelets: 320 10*3/uL (ref 150–450)
RBC: 4.22 x10E6/uL (ref 4.14–5.80)
RDW: 11.9 % (ref 11.6–15.4)
WBC: 8.4 10*3/uL (ref 3.4–10.8)

## 2022-10-15 LAB — IRON AND TIBC
Iron Saturation: 43 % (ref 15–55)
Iron: 173 ug/dL — ABNORMAL HIGH (ref 38–169)
Total Iron Binding Capacity: 400 ug/dL (ref 250–450)
UIBC: 227 ug/dL (ref 111–343)

## 2022-10-15 LAB — BASIC METABOLIC PANEL
BUN/Creatinine Ratio: 14 (ref 9–20)
BUN: 13 mg/dL (ref 6–24)
CO2: 21 mmol/L (ref 20–29)
Calcium: 9.4 mg/dL (ref 8.7–10.2)
Chloride: 106 mmol/L (ref 96–106)
Creatinine, Ser: 0.92 mg/dL (ref 0.76–1.27)
Glucose: 88 mg/dL (ref 70–99)
Potassium: 4.8 mmol/L (ref 3.5–5.2)
Sodium: 148 mmol/L — ABNORMAL HIGH (ref 134–144)
eGFR: 99 mL/min/{1.73_m2} (ref >59)

## 2022-10-15 LAB — MICROALBUMIN / CREATININE URINE RATIO
Creatinine, Urine: 41.8 mg/dL
Microalb/Creat Ratio: <7 mg/g creat (ref 0–29)
Microalbumin, Urine: <3 ug/mL

## 2022-10-15 LAB — FERRITIN: Ferritin: 33 ng/mL (ref 30–400)

## 2022-10-15 LAB — HEMOGLOBIN A1C
Est. average glucose Bld gHb Est-mCnc: 126 mg/dL
Hgb A1c MFr Bld: 6 % — ABNORMAL HIGH (ref 4.8–5.6)

## 2022-10-16 ENCOUNTER — Other Ambulatory Visit: Payer: Self-pay | Admitting: Family

## 2022-10-16 DIAGNOSIS — E870 Hyperosmolality and hypernatremia: Secondary | ICD-10-CM

## 2022-10-16 NOTE — Progress Notes (Signed)
Sodium a bit high, are you salting your foods or taking in a high salt diet? I do suggest we cut back a bit and repeat in three weeks as a lab only appt.   He is also prediabetic at 6.0. This is a jump from 7 months ago. Stress working on a diabetic diet and starting an exercise routine as tolerated.  Please confirm pt taking iron I believe he is. Iron is high but this is expected as taking iron. Still slightly anemic.stable from last.storage of iron called ferritin is low so have pt keep taking iron.   Stress importance of celiac friendly diet. Ask pt  if he would be interested in a referral for celiac nutritionist?

## 2022-10-17 ENCOUNTER — Encounter: Payer: Self-pay | Admitting: Surgery

## 2022-10-17 ENCOUNTER — Telehealth: Payer: Self-pay | Admitting: Surgery

## 2022-10-17 ENCOUNTER — Ambulatory Visit: Payer: BC Managed Care – PPO | Admitting: Surgery

## 2022-10-17 VITALS — BP 158/92 | HR 85 | Temp 99.6°F | Ht 73.0 in | Wt 342.6 lb

## 2022-10-17 DIAGNOSIS — K429 Umbilical hernia without obstruction or gangrene: Secondary | ICD-10-CM

## 2022-10-17 DIAGNOSIS — Z6841 Body Mass Index (BMI) 40.0 and over, adult: Secondary | ICD-10-CM

## 2022-10-17 NOTE — H&P (View-Only) (Signed)
Patient ID: Benjamin Tapia, male   DOB: 09/21/1965, 56 y.o.   MRN: [id number]  Chief Complaint: Umbilical hernia  History of Present Illness Benjamin Tapia is a 56 y.o. male with a known small umbilical hernia proximately 1 and half years, since previously appreciated.  Moved to fridge in November and pain became worse, with slightly increased bulge of his known umbilical hernia.  Tingling pain adjacent.  Slight bluish discoloration when seen by PCP.  Denies nausea, vomiting, fevers and chills.  No prior abdominal hernia surgery or abdominal procedures.  Past Medical History Past Medical History:  Diagnosis Date   ALLERGIC RHINITIS    Asthma    Celiac sprue    Hypertension    Sleep apnea       Past Surgical History:  Procedure Laterality Date   COLONOSCOPY WITH PROPOFOL N/A 05/19/2022   Procedure: COLONOSCOPY WITH PROPOFOL;  Surgeon: Anna, Kiran, MD;  Location: [facility] ENDOSCOPY;  Service: Gastroenterology;  Laterality: N/A;   TONSILLECTOMY      Allergies  Allergen Reactions   Amlodipine Swelling    Current Outpatient Medications  Medication Sig Dispense Refill   albuterol (VENTOLIN HFA) 108 (90 Base) MCG/ACT inhaler INHALE 1-2 PUFFS BY MOUTH EVERY 6 HOURS AS NEEDED FOR WHEEZE OR SHORTNESS OF BREATH 18 each 1   aspirin EC 81 MG tablet Take 81 mg by mouth daily. Swallow whole.     budesonide-formoterol (SYMBICORT) 160-4.5 MCG/ACT inhaler INHALE 1 PUFF INTO THE LUNGS IN THE MORNING AND AT BEDTIME. RINSE MOUTH AFTER EACH USE 10.2 each 5   Cetirizine HCl (ZYRTEC ALLERGY PO) ZyrTEC     ferrous gluconate (FERGON) 325 MG tablet Take 325 mg by mouth 2 (two) times daily.     fluocinonide-emollient (LIDEX-E) 0.05 % cream APPLY TO AFFECTED AREA TWICE A DAY 60 g 0   fluticasone (FLONASE) 50 MCG/ACT nasal spray Place 1 spray into both nostrils daily.     spironolactone (ALDACTONE) 25 MG tablet Take 1 tablet (25 mg total) by mouth daily. 30 tablet 1   valsartan-hydrochlorothiazide (DIOVAN-HCT) 320-25  MG tablet Take 1 tablet by mouth daily. 90 tablet 1   No current facility-administered medications for this visit.    Family History Family History  Problem Relation Age of Onset   Hypertension Mother    Autoimmune disease Mother    Hypertension Father    Hyperlipidemia Father    Factor V Leiden deficiency Father 60       DVT and PE   Diverticulosis Father    Interstitial cystitis Brother        some lung autoimmune disease   Leukemia Maternal Grandfather    Stroke Paternal Grandfather    Diabetes type I Daughter       Social History Social History   Tobacco Use   Smoking status: Never   Smokeless tobacco: Never  Vaping Use   Vaping Use: Never used  Substance Use Topics   Alcohol use: Yes    Comment: social   Drug use: No        Review of Systems  All other systems reviewed and are negative.    Physical Exam Blood pressure (!) 158/92, pulse 85, temperature 99.6 F (37.6 C), temperature source Oral, height 6' 1" (1.854 m), weight (!) 342 lb 9.6 oz (155.4 kg), SpO2 98 %. Last Weight  Most recent update: 10/17/2022 11:14 AM    Weight  155.4 kg (342 lb 9.6 oz)                 CONSTITUTIONAL: Well developed, and nourished, appropriately responsive and aware without distress.  Morbidly obese male. EYES: Sclera non-icteric.   EARS, NOSE, MOUTH AND THROAT:  The oropharynx is clear. Oral mucosa is pink and moist.    Hearing is intact to voice.  NECK: Trachea is midline, and there is no jugular venous distension.  LYMPH NODES:  Lymph nodes in the neck are not appreciated. RESPIRATORY:  Lungs are clear, and breath sounds are equal bilaterally.  Normal respiratory effort without pathologic use of accessory muscles. CARDIOVASCULAR: Heart is regular in rate and rhythm.   Well perfused.  GI: The abdomen is well rounded, obese, with diastases recti, small difficult to appreciate umbilical fascial defect with bulging central umbilical skin, mildly tender without dermal  changes of cellulitis or ecchymosis.  Otherwise soft, nontender, and nondistended. There were no palpable masses.  I did not appreciate hepatosplenomegaly.  MUSCULOSKELETAL:  Symmetrical muscle tone appreciated in all four extremities.    SKIN: Skin turgor is normal. No pathologic skin lesions appreciated.  NEUROLOGIC:  Motor and sensation appear grossly normal.  Cranial nerves are grossly without defect. PSYCH:  Alert and oriented to person, place and time. Affect is appropriate for situation.  Data Reviewed I have personally reviewed what is currently available of the patient's imaging, recent labs and medical records.   Labs:     Latest Ref Rng & Units 10/13/2022   11:16 AM 02/23/2022    7:32 AM 10/14/2020    8:54 AM  CBC  WBC 3.4 - 10.8 x10E3/uL 8.4  6.6  7.4   Hemoglobin 13.0 - 17.7 g/dL 12.6  12.7  13.3   Hematocrit 37.5 - 51.0 % 38.2  39.6  40.5   Platelets 150 - 450 x10E3/uL 320  286.0  331.0       Latest Ref Rng & Units 10/13/2022   11:16 AM 06/02/2022    7:56 AM 02/23/2022    7:32 AM  CMP  Glucose 70 - 99 mg/dL 88  116  103   BUN 6 - 24 mg/dL 13  14  16   Creatinine 0.76 - 1.27 mg/dL 0.92  1.04  1.00   Sodium 134 - 144 mmol/L 148  140  138   Potassium 3.5 - 5.2 mmol/L 4.8  4.5  4.3   Chloride 96 - 106 mmol/L 106  100  102   CO2 20 - 29 mmol/L 21  25  29   Calcium 8.7 - 10.2 mg/dL 9.4  9.3  9.1   Total Protein 6.0 - 8.3 g/dL   6.8   Total Bilirubin 0.2 - 1.2 mg/dL   0.5   Alkaline Phos 39 - 117 U/L   55   AST 0 - 37 U/L   16   ALT 0 - 53 U/L   20       Imaging: Radiological images reviewed:   Within last 24 hrs: No results found.  Assessment    Umbilical hernia, morbid obesity Patient Active Problem List   Diagnosis Date Noted   Diverticulosis 10/13/2022   Hyperglycemia 10/13/2022   Class 3 severe obesity due to excess calories with serious comorbidity and body mass index (BMI) of 45.0 to 49.9 in adult (HCC) 10/13/2022   Umbilical hernia with obstruction,  without gangrene 10/13/2022   Encounter for screening colonoscopy    Hyperlipidemia 02/09/2022   Prediabetes 02/09/2022   Umbilical hernia without obstruction and without gangrene 02/09/2022   Essential hypertension 10/16/2020   BMI 45.0-49.9, adult (HCC) 06/13/2018     Accessory skin tags 06/13/2018   Psoriasis 11/04/2015   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  We discussed various options of hernia repair including with and without mesh, open approach versus laparoscopic or robotic.  We also discussed potential for deferring hernia repair till after weight loss was a completed.  The high risk of hernia recurrence in the midst of his morbid obesity and without mesh reinforcement.  Along with potential complications of mesh.  I believe he understands all the above, and desires to proceed with primary repair. I discussed possibility of incarceration, strangulation, enlargement in size over time, and the need for emergency surgery in the face of these.  Also reviewed the techniques of reduction should incarceration occur, and when unsuccessful to present to the ED.  Also discussed that surgery risks include recurrence which can be up to 30% in the case of complex hernias, use of prosthetic materials (mesh) and the increased risk of infection and the possible need for re-operation and removal of mesh, possibility of post-op SBO or ileus, and the risks of general anesthetic including heart attack, stroke, sudden death or some reaction to anesthetic medications. The patient, and those present, appear to understand the risks, any and all questions were answered to the patient's satisfaction.  No guarantees were ever expressed or implied.   Anterior abdominal wall hernia repair with less than 3 cm  fascial defect, open.  Possible reinforcement with mesh.  Face-to-face time spent with the patient and accompanying care providers(if present) was 30 minutes, with more than 50% of the time spent counseling, educating, and coordinating care of the patient.    These notes generated with voice recognition software. I apologize for typographical errors.  Sherif Millspaugh M.D., FACS 10/17/2022, 1:00 PM     

## 2022-10-17 NOTE — Patient Instructions (Signed)
Our surgery scheduler Pamala Hurry will call you within 24-48 hours to get you scheduled. If you have not heard from her after 48 hours, please call our office. Have the blue sheet available when she calls to write down important information.   If you have any concerns or questions, please feel free to call our office.   Umbilical Hernia, Adult  A hernia is a bulge of tissue that pushes through an opening between muscles. An umbilical hernia happens in the abdomen, near the belly button (umbilicus). The hernia may contain tissues from the small intestine, large intestine, or fatty tissue covering the intestines. Umbilical hernias in adults tend to get worse over time, and they require surgical treatment. There are different types of umbilical hernias, including: Indirect hernia. This type is located just above or below the umbilicus. It is the most common type of umbilical hernia in adults. Direct hernia. This type forms through an opening formed by the umbilicus. Reducible hernia. This type of hernia comes and goes. It may be visible only when you strain, lift something heavy, or cough. This type of hernia can be pushed back into the abdomen (reduced). Incarcerated hernia. This type traps abdominal tissue inside the hernia. This type of hernia cannot be reduced. Strangulated hernia. This type of hernia cuts off blood flow to the tissues inside the hernia. The tissues can start to die if this happens. This type of hernia requires emergency treatment. What are the causes? An umbilical hernia happens when tissue inside the abdomen presses on a weak area of the abdominal muscles. What increases the risk? You may have a greater risk of this condition if you: Are obese. Have had several pregnancies. Have a buildup of fluid inside your abdomen. Have had surgery that weakens the abdominal muscles. What are the signs or symptoms? The main symptom of this condition is a painless bulge at or near the belly  button. A reducible hernia may be visible only when you strain, lift something heavy, or cough. Other symptoms may include: Dull pain. A feeling of pressure. Symptoms of a strangulated hernia may include: Pain that gets increasingly worse. Nausea and vomiting. Pain when pressing on the hernia. Skin over the hernia becoming red or purple. Constipation. Blood in the stool. How is this diagnosed? This condition may be diagnosed based on: A physical exam. You may be asked to cough or strain while standing. These actions increase the pressure inside your abdomen and can force the hernia through the opening in your muscles. Your health care provider may try to reduce the hernia by pressing on it. Your symptoms and medical history. How is this treated? Surgery is the only treatment for an umbilical hernia. Surgery for a strangulated hernia is done as soon as possible. If you have a small hernia that is not incarcerated, you may need to lose weight before having surgery. Follow these instructions at home: Lose weight, if told by your health care provider. Do not try to push the hernia back in. Watch your hernia for any changes in color or size. Tell your health care provider if any changes occur. You may need to avoid activities that increase pressure on your hernia. Do not lift anything that is heavier than 10 lb (4.5 kg), or the limit that you are told, until your health care provider says that it is safe. Take over-the-counter and prescription medicines only as told by your health care provider. Keep all follow-up visits. This is important. Contact a health care  provider if: Your hernia gets larger. Your hernia becomes painful. Get help right away if: You develop sudden, severe pain near the area of your hernia. You have pain as well as nausea or vomiting. You have pain and the skin over your hernia changes color. You develop a fever or chills. Summary A hernia is a bulge of tissue that  pushes through an opening between muscles. An umbilical hernia happens near the belly button. Surgery is the only treatment for an umbilical hernia. Do not try to push your hernia back in. Keep all follow-up visits. This is important. This information is not intended to replace advice given to you by your health care provider. Make sure you discuss any questions you have with your health care provider. Document Revised: 03/22/2020 Document Reviewed: 03/22/2020 Elsevier Patient Education  [place].

## 2022-10-17 NOTE — Telephone Encounter (Signed)
Patient has been advised of Pre-Admission date/time, and Surgery date at [facility].  Surgery Date: 11/15/22 Preadmission Testing Date: 11/07/22 (phone 8a-1p)  Patient has been made aware to call [phone], between 1-3:00pm the day before surgery, to find out what time to arrive for surgery.

## 2022-10-17 NOTE — Progress Notes (Signed)
Patient ID: Benjamin Tapia, male   DOB: May 01, 1966, 57 y.o.   MRN: [id number]  Chief Complaint: Umbilical hernia  History of Present Illness Benjamin Tapia is a 57 y.o. male with a known small umbilical hernia proximately 1 and half years, since previously appreciated.  Moved to fridge in November and pain became worse, with slightly increased bulge of his known umbilical hernia.  Tingling pain adjacent.  Slight bluish discoloration when seen by PCP.  Denies nausea, vomiting, fevers and chills.  No prior abdominal hernia surgery or abdominal procedures.  Past Medical History Past Medical History:  Diagnosis Date   ALLERGIC RHINITIS    Asthma    Celiac sprue    Hypertension    Sleep apnea       Past Surgical History:  Procedure Laterality Date   COLONOSCOPY WITH PROPOFOL N/A 05/19/2022   Procedure: COLONOSCOPY WITH PROPOFOL;  Surgeon: Jonathon Bellows, MD;  Location: [facility] ENDOSCOPY;  Service: Gastroenterology;  Laterality: N/A;   TONSILLECTOMY      Allergies  Allergen Reactions   Amlodipine Swelling    Current Outpatient Medications  Medication Sig Dispense Refill   albuterol (VENTOLIN HFA) 108 (90 Base) MCG/ACT inhaler INHALE 1-2 PUFFS BY MOUTH EVERY 6 HOURS AS NEEDED FOR WHEEZE OR SHORTNESS OF BREATH 18 each 1   aspirin EC 81 MG tablet Take 81 mg by mouth daily. Swallow whole.     budesonide-formoterol (SYMBICORT) 160-4.5 MCG/ACT inhaler INHALE 1 PUFF INTO THE LUNGS IN THE MORNING AND AT BEDTIME. RINSE MOUTH AFTER EACH USE 10.2 each 5   Cetirizine HCl (ZYRTEC ALLERGY PO) ZyrTEC     ferrous gluconate (FERGON) 325 MG tablet Take 325 mg by mouth 2 (two) times daily.     fluocinonide-emollient (LIDEX-E) 0.05 % cream APPLY TO AFFECTED AREA TWICE A DAY 60 g 0   fluticasone (FLONASE) 50 MCG/ACT nasal spray Place 1 spray into both nostrils daily.     spironolactone (ALDACTONE) 25 MG tablet Take 1 tablet (25 mg total) by mouth daily. 30 tablet 1   valsartan-hydrochlorothiazide (DIOVAN-HCT) 320-25  MG tablet Take 1 tablet by mouth daily. 90 tablet 1   No current facility-administered medications for this visit.    Family History Family History  Problem Relation Age of Onset   Hypertension Mother    Autoimmune disease Mother    Hypertension Father    Hyperlipidemia Father    Factor V Leiden deficiency Father 3       DVT and PE   Diverticulosis Father    Interstitial cystitis Brother        some lung autoimmune disease   Leukemia Maternal Grandfather    Stroke Paternal Grandfather    Diabetes type I Daughter       Social History Social History   Tobacco Use   Smoking status: Never   Smokeless tobacco: Never  Vaping Use   Vaping Use: Never used  Substance Use Topics   Alcohol use: Yes    Comment: social   Drug use: No        Review of Systems  All other systems reviewed and are negative.    Physical Exam Blood pressure (!) 158/92, pulse 85, temperature 99.6 F (37.6 C), temperature source Oral, height 6' 1"$  (1.854 m), weight (!) 342 lb 9.6 oz (155.4 kg), SpO2 98 %. Last Weight  Most recent update: 10/17/2022 11:14 AM    Weight  155.4 kg (342 lb 9.6 oz)  CONSTITUTIONAL: Well developed, and nourished, appropriately responsive and aware without distress.  Morbidly obese male. EYES: Sclera non-icteric.   EARS, NOSE, MOUTH AND THROAT:  The oropharynx is clear. Oral mucosa is pink and moist.    Hearing is intact to voice.  NECK: Trachea is midline, and there is no jugular venous distension.  LYMPH NODES:  Lymph nodes in the neck are not appreciated. RESPIRATORY:  Lungs are clear, and breath sounds are equal bilaterally.  Normal respiratory effort without pathologic use of accessory muscles. CARDIOVASCULAR: Heart is regular in rate and rhythm.   Well perfused.  GI: The abdomen is well rounded, obese, with diastases recti, small difficult to appreciate umbilical fascial defect with bulging central umbilical skin, mildly tender without dermal  changes of cellulitis or ecchymosis.  Otherwise soft, nontender, and nondistended. There were no palpable masses.  I did not appreciate hepatosplenomegaly.  MUSCULOSKELETAL:  Symmetrical muscle tone appreciated in all four extremities.    SKIN: Skin turgor is normal. No pathologic skin lesions appreciated.  NEUROLOGIC:  Motor and sensation appear grossly normal.  Cranial nerves are grossly without defect. PSYCH:  Alert and oriented to person, place and time. Affect is appropriate for situation.  Data Reviewed I have personally reviewed what is currently available of the patient's imaging, recent labs and medical records.   Labs:     Latest Ref Rng & Units 10/13/2022   11:16 AM 02/23/2022    7:32 AM 10/14/2020    8:54 AM  CBC  WBC 3.4 - 10.8 x10E3/uL 8.4  6.6  7.4   Hemoglobin 13.0 - 17.7 g/dL 12.6  12.7  13.3   Hematocrit 37.5 - 51.0 % 38.2  39.6  40.5   Platelets 150 - 450 x10E3/uL 320  286.0  331.0       Latest Ref Rng & Units 10/13/2022   11:16 AM 06/02/2022    7:56 AM 02/23/2022    7:32 AM  CMP  Glucose 70 - 99 mg/dL 88  116  103   BUN 6 - 24 mg/dL 13  14  16   $ Creatinine 0.76 - 1.27 mg/dL 0.92  1.04  1.00   Sodium 134 - 144 mmol/L 148  140  138   Potassium 3.5 - 5.2 mmol/L 4.8  4.5  4.3   Chloride 96 - 106 mmol/L 106  100  102   CO2 20 - 29 mmol/L 21  25  29   $ Calcium 8.7 - 10.2 mg/dL 9.4  9.3  9.1   Total Protein 6.0 - 8.3 g/dL   6.8   Total Bilirubin 0.2 - 1.2 mg/dL   0.5   Alkaline Phos 39 - 117 U/L   55   AST 0 - 37 U/L   16   ALT 0 - 53 U/L   20       Imaging: Radiological images reviewed:   Within last 24 hrs: No results found.  Assessment    Umbilical hernia, morbid obesity Patient Active Problem List   Diagnosis Date Noted   Diverticulosis 10/13/2022   Hyperglycemia 10/13/2022   Class 3 severe obesity due to excess calories with serious comorbidity and body mass index (BMI) of 45.0 to 49.9 in adult [facility]) [id number]   Umbilical hernia with obstruction,  without gangrene 10/13/2022   Encounter for screening colonoscopy    Hyperlipidemia 02/09/2022   Prediabetes [id number]   Umbilical hernia without obstruction and without gangrene 02/09/2022   Essential hypertension 10/16/2020   BMI 45.0-49.9, adult ([place]) 06/13/2018  Accessory skin tags 06/13/2018   Psoriasis 11/04/2015   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  We discussed various options of hernia repair including with and without mesh, open approach versus laparoscopic or robotic.  We also discussed potential for deferring hernia repair till after weight loss was a completed.  The high risk of hernia recurrence in the midst of his morbid obesity and without mesh reinforcement.  Along with potential complications of mesh.  I believe he understands all the above, and desires to proceed with primary repair. I discussed possibility of incarceration, strangulation, enlargement in size over time, and the need for emergency surgery in the face of these.  Also reviewed the techniques of reduction should incarceration occur, and when unsuccessful to present to the ED.  Also discussed that surgery risks include recurrence which can be up to 30% in the case of complex hernias, use of prosthetic materials (mesh) and the increased risk of infection and the possible need for re-operation and removal of mesh, possibility of post-op SBO or ileus, and the risks of general anesthetic including heart attack, stroke, sudden death or some reaction to anesthetic medications. The patient, and those present, appear to understand the risks, any and all questions were answered to the patient's satisfaction.  No guarantees were ever expressed or implied.   Anterior abdominal wall hernia repair with less than 3 cm  fascial defect, open.  Possible reinforcement with mesh.  Face-to-face time spent with the patient and accompanying care providers(if present) was 30 minutes, with more than 50% of the time spent counseling, educating, and coordinating care of the patient.    These notes generated with voice recognition software. I apologize for typographical errors.  Ronny Bacon M.D., FACS 10/17/2022, 1:00 PM

## 2022-10-18 ENCOUNTER — Other Ambulatory Visit: Payer: Self-pay | Admitting: Family

## 2022-10-18 DIAGNOSIS — E870 Hyperosmolality and hypernatremia: Secondary | ICD-10-CM

## 2022-10-18 DIAGNOSIS — I10 Essential (primary) hypertension: Secondary | ICD-10-CM

## 2022-10-18 NOTE — Progress Notes (Signed)
Let's verify this was not a lab error, let's have pt come in for lab only to repeat sodium before we move forward. Glad to hear he is using low salt alternative.

## 2022-10-24 LAB — FACTOR V LEIDEN

## 2022-10-31 ENCOUNTER — Other Ambulatory Visit: Payer: Self-pay

## 2022-10-31 DIAGNOSIS — I10 Essential (primary) hypertension: Secondary | ICD-10-CM

## 2022-10-31 NOTE — Telephone Encounter (Signed)
Received a paper prescription

## 2022-11-02 MED ORDER — VALSARTAN-HYDROCHLOROTHIAZIDE 320-25 MG PO TABS: 1.0000 | ORAL_TABLET | Freq: Every day | ORAL | 1 refills | Status: DC

## 2022-11-07 ENCOUNTER — Other Ambulatory Visit: Payer: Self-pay

## 2022-11-07 ENCOUNTER — Ambulatory Visit: Payer: Self-pay | Admitting: Surgery

## 2022-11-07 ENCOUNTER — Encounter
Admission: RE | Admit: 2022-11-07 | Discharge: 2022-11-07 | Disposition: A | Discharge status: Home / Self Care | Payer: BC Managed Care – PPO | Source: Ambulatory Visit | Attending: Surgery | Admitting: Surgery

## 2022-11-07 DIAGNOSIS — Z01812 Encounter for preprocedural laboratory examination: Secondary | ICD-10-CM

## 2022-11-07 DIAGNOSIS — K429 Umbilical hernia without obstruction or gangrene: Secondary | ICD-10-CM

## 2022-11-07 DIAGNOSIS — J4540 Moderate persistent asthma, uncomplicated: Secondary | ICD-10-CM

## 2022-11-07 HISTORY — DX: Pneumonia, unspecified organism: J18.9

## 2022-11-07 HISTORY — DX: Anemia, unspecified: D64.9

## 2022-11-07 HISTORY — DX: Prediabetes: R73.03

## 2022-11-07 HISTORY — DX: Family history of other specified conditions: Z84.89

## 2022-11-07 NOTE — Patient Instructions (Addendum)
Your procedure is scheduled on: 11/15/22 - Wednesday Report to the Registration Desk on the 1st floor of the [facility]. To find out your arrival time, please call [phone] between 1PM - 3PM on: 11/14/22 - Tuesday If your arrival time is 6:00 am, do not arrive before that time as the [facility] entrance doors do not open until 6:00 am.  REMEMBER: Instructions that are not followed completely may result in serious medical risk, up to and including death; or upon the discretion of your surgeon and anesthesiologist your surgery may need to be rescheduled.  Do not eat food after midnight the night before surgery.  No gum chewing or hard candies.  You may however, drink CLEAR liquids up to 2 hours before you are scheduled to arrive for your surgery. Do not drink anything within 2 hours of your scheduled arrival time.  Clear liquids include: - water  - apple juice without pulp - gatorade (not RED colors) - black coffee or tea (Do NOT add milk or creamers to the coffee or tea) Do NOT drink anything that is not on this list.  One week prior to surgery beginning 11/08/22. Stop Anti-inflammatories (NSAIDS) such as Advil, Aleve, Ibuprofen, Motrin, Naproxen, Naprosyn and Aspirin based products such as Excedrin, Goody's Powder, BC Powder.  Stop ANY OVER THE COUNTER supplements until after surgery beginning 03/13 /24.  You may however, continue to take Tylenol if needed for pain up until the day of surgery.   TAKE ONLY THESE MEDICATIONS THE MORNING OF SURGERY WITH A SIP OF WATER:  albuterol (VENTOLIN HFA) use the morning before surgery and bring to the hospital with you. budesonide-formoterol (SYMBICORT)  ferrous gluconate (FERGON)    No Alcohol for 24 hours before or after surgery.  No Smoking including e-cigarettes for 24 hours before surgery.  No chewable tobacco products for at least 6 hours before surgery.  No nicotine patches on the day of surgery.  Do not use any  "recreational" drugs for at least a week (preferably 2 weeks) before your surgery.  Please be advised that the combination of cocaine and anesthesia may have negative outcomes, up to and including death. If you test positive for cocaine, your surgery will be cancelled.  On the morning of surgery brush your teeth with toothpaste and water, you may rinse your mouth with mouthwash if you wish. Do not swallow any toothpaste or mouthwash.  Use CHG Soap or wipes as directed on instruction sheet.  Do not wear jewelry, make-up, hairpins, clips or nail polish.  Do not wear lotions, powders, or perfumes.   Do not shave body hair from the neck down 48 hours before surgery.  Contact lenses, hearing aids and dentures may not be worn into surgery.  Do not bring valuables to the hospital. [facility] is not responsible for any missing/lost belongings or valuables.   Bring your C-PAP to the hospital in case you may have to spend the night.   Notify your doctor if there is any change in your medical condition (cold, fever, infection).  Wear comfortable clothing (specific to your surgery type) to the hospital.  After surgery, you can help prevent lung complications by doing breathing exercises.  Take deep breaths and cough every 1-2 hours. Your doctor may order a device called an Incentive Spirometer to help you take deep breaths. When coughing or sneezing, hold a pillow firmly against your incision with both hands. This is called "splinting." Doing this helps protect your incision. It also  decreases belly discomfort.  If you are being admitted to the hospital overnight, leave your suitcase in the car. After surgery it may be brought to your room.  In case of increased patient census, it may be necessary for you, the patient, to continue your postoperative care in the Same Day Surgery department.  If you are being discharged the day of surgery, you will not be allowed to drive home. You will need a  responsible individual to drive you home and stay with you for 24 hours after surgery.   If you are taking public transportation, you will need to have a responsible individual with you.  Please call the [facility] Dept. at [phone] if you have any questions about these instructions.  Surgery Visitation Policy:  Patients undergoing a surgery or procedure may have two family members or support persons with them as long as the person is not COVID-19 positive or experiencing its symptoms.   Inpatient Visitation:    Visiting hours are 7 a.m. to 8 p.m. Up to four visitors are allowed at one time in a patient room. The visitors may rotate out with other people during the day. One designated support person (adult) may remain overnight.  Due to an increase in RSV and influenza rates and associated hospitalizations, children ages 63 and under will not be able to visit patients in [facility]. Masks continue to be strongly recommended.

## 2022-11-08 ENCOUNTER — Encounter
Admission: RE | Admit: 2022-11-08 | Discharge: 2022-11-08 | Disposition: A | Discharge status: Home / Self Care | Payer: BC Managed Care – PPO | Source: Ambulatory Visit | Attending: Surgery | Admitting: Surgery

## 2022-11-08 ENCOUNTER — Encounter: Payer: Self-pay | Admitting: Family

## 2022-11-08 DIAGNOSIS — Z01812 Encounter for preprocedural laboratory examination: Secondary | ICD-10-CM

## 2022-11-08 DIAGNOSIS — Z01818 Encounter for other preprocedural examination: Secondary | ICD-10-CM | POA: Diagnosis present

## 2022-11-08 LAB — BASIC METABOLIC PANEL
Anion gap: 7 (ref 5–15)
BUN: 20 mg/dL (ref 6–20)
CO2: 24 mmol/L (ref 22–32)
Calcium: 9.1 mg/dL (ref 8.9–10.3)
Chloride: 106 mmol/L (ref 98–111)
Creatinine, Ser: 0.98 mg/dL (ref 0.61–1.24)
GFR, Estimated: >60 mL/min (ref >60)
Glucose, Bld: 109 mg/dL — ABNORMAL HIGH (ref 70–99)
Potassium: 3.8 mmol/L (ref 3.5–5.1)
Sodium: 137 mmol/L (ref 135–145)

## 2022-11-08 MED ORDER — BUDESONIDE-FORMOTEROL FUMARATE 160-4.5 MCG/ACT IN AERO: INHALATION_SPRAY | RESPIRATORY_TRACT | 5 refills | Status: DC

## 2022-11-08 NOTE — Telephone Encounter (Signed)
Refill request for budesonide-formoterol (SYMBICORT) 160-4.5 MCG/ACT inhaler   LV- 10/13/22 NV- Not scheduled LR- 02/09/22 ( 10.2 each/ 5 refills)

## 2022-11-15 ENCOUNTER — Encounter: Payer: Self-pay | Admitting: Surgery

## 2022-11-15 ENCOUNTER — Other Ambulatory Visit: Payer: Self-pay

## 2022-11-15 ENCOUNTER — Ambulatory Visit
Admission: RE | Admit: 2022-11-15 | Discharge: 2022-11-15 | Disposition: A | Discharge status: Home / Self Care | Payer: BC Managed Care – PPO | Attending: Surgery | Admitting: Surgery

## 2022-11-15 ENCOUNTER — Ambulatory Visit: Payer: BC Managed Care – PPO | Admitting: Urgent Care

## 2022-11-15 ENCOUNTER — Encounter
Admission: RE | Disposition: A | Discharge status: Home / Self Care | Payer: Self-pay | Source: Home / Self Care | Attending: Surgery

## 2022-11-15 DIAGNOSIS — I10 Essential (primary) hypertension: Secondary | ICD-10-CM | POA: Diagnosis not present

## 2022-11-15 DIAGNOSIS — Z8249 Family history of ischemic heart disease and other diseases of the circulatory system: Secondary | ICD-10-CM | POA: Diagnosis not present

## 2022-11-15 DIAGNOSIS — K420 Umbilical hernia with obstruction, without gangrene: Secondary | ICD-10-CM | POA: Diagnosis not present

## 2022-11-15 DIAGNOSIS — J45909 Unspecified asthma, uncomplicated: Secondary | ICD-10-CM | POA: Diagnosis not present

## 2022-11-15 DIAGNOSIS — G4730 Sleep apnea, unspecified: Secondary | ICD-10-CM | POA: Insufficient documentation

## 2022-11-15 DIAGNOSIS — K429 Umbilical hernia without obstruction or gangrene: Secondary | ICD-10-CM

## 2022-11-15 HISTORY — PX: UMBILICAL HERNIA REPAIR: SHX196

## 2022-11-15 SURGERY — REPAIR, HERNIA, UMBILICAL, ADULT
Anesthesia: General

## 2022-11-15 MED ORDER — 0.9 % SODIUM CHLORIDE (POUR BTL) OPTIME
TOPICAL | Status: DC | PRN
  Administered 2022-11-15: 200 mL

## 2022-11-15 MED ORDER — EPINEPHRINE PF 1 MG/ML IJ SOLN
INTRAMUSCULAR | Status: AC
  Filled 2022-11-15: qty 1

## 2022-11-15 MED ORDER — BUPIVACAINE LIPOSOME 1.3 % IJ SUSP: 20.0000 mL | Freq: Once | INTRAMUSCULAR | Status: DC

## 2022-11-15 MED ORDER — ROCURONIUM BROMIDE 10 MG/ML (PF) SYRINGE
PREFILLED_SYRINGE | INTRAVENOUS | Status: AC
  Filled 2022-11-15: qty 10

## 2022-11-15 MED ORDER — CELECOXIB 200 MG PO CAPS: 200.0000 mg | ORAL_CAPSULE | ORAL | Status: AC

## 2022-11-15 MED ORDER — GLYCOPYRROLATE 0.2 MG/ML IJ SOLN
INTRAMUSCULAR | Status: AC
  Filled 2022-11-15: qty 1

## 2022-11-15 MED ORDER — BUPIVACAINE HCL (PF) 0.25 % IJ SOLN
INTRAMUSCULAR | Status: AC
  Filled 2022-11-15: qty 30

## 2022-11-15 MED ORDER — CEFAZOLIN IN SODIUM CHLORIDE 3-0.9 GM/100ML-% IV SOLN
3.0000 g | INTRAVENOUS | Status: AC
  Administered 2022-11-15: 3 g via INTRAVENOUS
  Filled 2022-11-15: qty 100

## 2022-11-15 MED ORDER — ACETAMINOPHEN 500 MG PO TABS: 1000.0000 mg | ORAL_TABLET | ORAL | Status: AC

## 2022-11-15 MED ORDER — ONDANSETRON HCL 4 MG/2ML IJ SOLN
INTRAMUSCULAR | Status: DC | PRN
  Administered 2022-11-15: 4 mg via INTRAVENOUS

## 2022-11-15 MED ORDER — PROPOFOL 10 MG/ML IV BOLUS
INTRAVENOUS | Status: DC | PRN
  Administered 2022-11-15: 200 mg via INTRAVENOUS
  Administered 2022-11-15: 50 mg via INTRAVENOUS

## 2022-11-15 MED ORDER — SUCCINYLCHOLINE CHLORIDE 200 MG/10ML IV SOSY
PREFILLED_SYRINGE | INTRAVENOUS | Status: DC | PRN
  Administered 2022-11-15: 120 mg via INTRAVENOUS

## 2022-11-15 MED ORDER — LACTATED RINGERS IV SOLN: INTRAVENOUS | Status: DC

## 2022-11-15 MED ORDER — FAMOTIDINE 20 MG PO TABS: 20.0000 mg | ORAL_TABLET | Freq: Once | ORAL | Status: AC

## 2022-11-15 MED ORDER — HYDROCODONE-ACETAMINOPHEN 5-325 MG PO TABS: 1.0000 | ORAL_TABLET | Freq: Four times a day (QID) | ORAL | 0 refills | Status: DC | PRN

## 2022-11-15 MED ORDER — PROPOFOL 10 MG/ML IV BOLUS
INTRAVENOUS | Status: AC
  Filled 2022-11-15: qty 20

## 2022-11-15 MED ORDER — GABAPENTIN 300 MG PO CAPS
ORAL_CAPSULE | ORAL | Status: AC
  Administered 2022-11-15: 300 mg via ORAL
  Filled 2022-11-15: qty 1

## 2022-11-15 MED ORDER — ROCURONIUM BROMIDE 100 MG/10ML IV SOLN: INTRAVENOUS | Status: DC | PRN

## 2022-11-15 MED ORDER — CHLORHEXIDINE GLUCONATE 0.12 % MT SOLN: 15.0000 mL | Freq: Once | OROMUCOSAL | Status: AC

## 2022-11-15 MED ORDER — CELECOXIB 200 MG PO CAPS
ORAL_CAPSULE | ORAL | Status: AC
  Administered 2022-11-15: 200 mg via ORAL
  Filled 2022-11-15: qty 1

## 2022-11-15 MED ORDER — BUPIVACAINE HCL 0.25 % IJ SOLN
INTRAMUSCULAR | Status: DC | PRN
  Administered 2022-11-15: 30 mL

## 2022-11-15 MED ORDER — FENTANYL CITRATE (PF) 100 MCG/2ML IJ SOLN: 25.0000 ug | INTRAMUSCULAR | Status: DC | PRN

## 2022-11-15 MED ORDER — CHLORHEXIDINE GLUCONATE 0.12 % MT SOLN
OROMUCOSAL | Status: AC
  Filled 2022-11-15: qty 15

## 2022-11-15 MED ORDER — DEXAMETHASONE SODIUM PHOSPHATE 10 MG/ML IJ SOLN
INTRAMUSCULAR | Status: AC
  Filled 2022-11-15: qty 1

## 2022-11-15 MED ORDER — OXYCODONE HCL 5 MG/5ML PO SOLN: 5.0000 mg | Freq: Once | ORAL | Status: DC | PRN

## 2022-11-15 MED ORDER — ORAL CARE MOUTH RINSE
15.0000 mL | Freq: Once | OROMUCOSAL | Status: AC
  Administered 2022-11-15: 15 mL via OROMUCOSAL

## 2022-11-15 MED ORDER — GABAPENTIN 300 MG PO CAPS: 300.0000 mg | ORAL_CAPSULE | ORAL | Status: AC

## 2022-11-15 MED ORDER — OXYCODONE HCL 5 MG PO TABS: 5.0000 mg | ORAL_TABLET | Freq: Once | ORAL | Status: DC | PRN

## 2022-11-15 MED ORDER — ROCURONIUM BROMIDE 100 MG/10ML IV SOLN
INTRAVENOUS | Status: DC | PRN
  Administered 2022-11-15: 20 mg via INTRAVENOUS
  Administered 2022-11-15: 30 mg via INTRAVENOUS

## 2022-11-15 MED ORDER — LIDOCAINE HCL (PF) 2 % IJ SOLN
INTRAMUSCULAR | Status: AC
  Filled 2022-11-15: qty 5

## 2022-11-15 MED ORDER — ONDANSETRON HCL 4 MG/2ML IJ SOLN
INTRAMUSCULAR | Status: AC
  Filled 2022-11-15: qty 2

## 2022-11-15 MED ORDER — FENTANYL CITRATE (PF) 100 MCG/2ML IJ SOLN
INTRAMUSCULAR | Status: AC
  Filled 2022-11-15: qty 2

## 2022-11-15 MED ORDER — LIDOCAINE HCL (CARDIAC) PF 100 MG/5ML IV SOSY
PREFILLED_SYRINGE | INTRAVENOUS | Status: DC | PRN
  Administered 2022-11-15: 100 mg via INTRAVENOUS

## 2022-11-15 MED ORDER — ACETAMINOPHEN 500 MG PO TABS
ORAL_TABLET | ORAL | Status: AC
  Administered 2022-11-15: 1000 mg via ORAL
  Filled 2022-11-15: qty 2

## 2022-11-15 MED ORDER — PHENYLEPHRINE HCL (PRESSORS) 10 MG/ML IV SOLN
INTRAVENOUS | Status: DC | PRN
  Administered 2022-11-15 (×7): 80 ug via INTRAVENOUS

## 2022-11-15 MED ORDER — SUGAMMADEX SODIUM 200 MG/2ML IV SOLN
INTRAVENOUS | Status: DC | PRN
  Administered 2022-11-15: 400 mg via INTRAVENOUS

## 2022-11-15 MED ORDER — FENTANYL CITRATE (PF) 100 MCG/2ML IJ SOLN
INTRAMUSCULAR | Status: DC | PRN
  Administered 2022-11-15: 100 ug via INTRAVENOUS

## 2022-11-15 MED ORDER — DEXAMETHASONE SODIUM PHOSPHATE 10 MG/ML IJ SOLN
INTRAMUSCULAR | Status: DC | PRN
  Administered 2022-11-15: 10 mg via INTRAVENOUS

## 2022-11-15 MED ORDER — CHLORHEXIDINE GLUCONATE CLOTH 2 % EX PADS: 6.0000 | MEDICATED_PAD | Freq: Once | CUTANEOUS | Status: DC

## 2022-11-15 MED ORDER — FAMOTIDINE 20 MG PO TABS
ORAL_TABLET | ORAL | Status: AC
  Administered 2022-11-15: 20 mg via ORAL
  Filled 2022-11-15: qty 1

## 2022-11-15 MED ORDER — MIDAZOLAM HCL 2 MG/2ML IJ SOLN
INTRAMUSCULAR | Status: DC | PRN
  Administered 2022-11-15: 2 mg via INTRAVENOUS

## 2022-11-15 MED ORDER — LACTATED RINGERS IV SOLN: INTRAVENOUS | Status: DC | PRN

## 2022-11-15 MED ORDER — MIDAZOLAM HCL 2 MG/2ML IJ SOLN
INTRAMUSCULAR | Status: AC
  Filled 2022-11-15: qty 2

## 2022-11-15 SURGICAL SUPPLY — 33 items
ADH SKN CLS APL DERMABOND .7 (GAUZE/BANDAGES/DRESSINGS) ×1
APL PRP STRL LF DISP 70% ISPRP (MISCELLANEOUS) ×1
BLADE CLIPPER SURG (BLADE) IMPLANT
BLADE SURG 15 STRL LF DISP TIS (BLADE) ×2 IMPLANT
BLADE SURG 15 STRL SS (BLADE) ×1
CHLORAPREP W/TINT 26 (MISCELLANEOUS) ×2 IMPLANT
DERMABOND ADVANCED .7 DNX12 (GAUZE/BANDAGES/DRESSINGS) ×2 IMPLANT
DRAPE LAPAROTOMY 77X122 PED (DRAPES) ×2 IMPLANT
ELECT CAUTERY BLADE 6.4 (BLADE) ×2 IMPLANT
ELECT REM PT RETURN 9FT ADLT (ELECTROSURGICAL) ×1
ELECTRODE REM PT RTRN 9FT ADLT (ELECTROSURGICAL) ×2 IMPLANT
GAUZE 4X4 16PLY ~~LOC~~+RFID DBL (SPONGE) ×2 IMPLANT
GLOVE ORTHO TXT STRL SZ7.5 (GLOVE) ×2 IMPLANT
GOWN STRL REUS W/ TWL LRG LVL3 (GOWN DISPOSABLE) ×2 IMPLANT
GOWN STRL REUS W/ TWL XL LVL3 (GOWN DISPOSABLE) ×2 IMPLANT
GOWN STRL REUS W/TWL LRG LVL3 (GOWN DISPOSABLE) ×2
GOWN STRL REUS W/TWL XL LVL3 (GOWN DISPOSABLE) ×2
KIT TURNOVER KIT A (KITS) ×2 IMPLANT
MANIFOLD NEPTUNE II (INSTRUMENTS) ×2 IMPLANT
NDL HYPO 22X1.5 SAFETY MO (MISCELLANEOUS) ×2 IMPLANT
NEEDLE HYPO 22X1.5 SAFETY MO (MISCELLANEOUS) ×1 IMPLANT
NS IRRIG 500ML POUR BTL (IV SOLUTION) ×2 IMPLANT
PACK BASIN MINOR ARMC (MISCELLANEOUS) ×2 IMPLANT
SPIKE FLUID TRANSFER (MISCELLANEOUS) ×2 IMPLANT
SUT ETHIBOND 0 MO6 C/R (SUTURE) ×2 IMPLANT
SUT MNCRL 4-0 (SUTURE) ×1
SUT MNCRL 4-0 27XMFL (SUTURE) ×1
SUT VIC AB 3-0 SH 27 (SUTURE) ×1
SUT VIC AB 3-0 SH 27X BRD (SUTURE) ×2 IMPLANT
SUTURE MNCRL 4-0 27XMF (SUTURE) ×2 IMPLANT
SYR 10ML LL (SYRINGE) ×2 IMPLANT
TRAP FLUID SMOKE EVACUATOR (MISCELLANEOUS) ×2 IMPLANT
WATER STERILE IRR 500ML POUR (IV SOLUTION) ×2 IMPLANT

## 2022-11-15 NOTE — Discharge Instructions (Signed)
AMBULATORY SURGERY  ?DISCHARGE INSTRUCTIONS ? ? ?The drugs that you were given will stay in your system until tomorrow so for the next 24 hours you should not: ? ?Drive an automobile ?Make any legal decisions ?Drink any alcoholic beverage ? ? ?You may resume regular meals tomorrow.  Today it is better to start with liquids and gradually work up to solid foods. ? ?You may eat anything you prefer, but it is better to start with liquids, then soup and crackers, and gradually work up to solid foods. ? ? ?Please notify your doctor immediately if you have any unusual bleeding, trouble breathing, redness and pain at the surgery site, drainage, fever, or pain not relieved by medication. ? ? ? ?Additional Instructions: ? ? ? ?Please contact your physician with any problems or Same Day Surgery at [phone], Monday through Friday 6 am to 4 pm, or [place] at [place] Main number at [phone].  ?

## 2022-11-15 NOTE — Anesthesia Postprocedure Evaluation (Signed)
Anesthesia Post Note  Patient: Benjamin Tapia  Procedure(s) Performed: HERNIA REPAIR UMBILICAL ADULT, open, possibly with mesh  Patient location during evaluation: PACU Anesthesia Type: General Level of consciousness: awake and alert Pain management: pain level controlled Vital Signs Assessment: post-procedure vital signs reviewed and stable Respiratory status: spontaneous breathing, nonlabored ventilation, respiratory function stable and patient connected to nasal cannula oxygen Cardiovascular status: blood pressure returned to baseline and stable Postop Assessment: no apparent nausea or vomiting Anesthetic complications: no  No notable events documented.   Last Vitals:  Vitals:   11/15/22 1100 11/15/22 1115  BP: 105/67 127/70  Pulse: 78 74  Resp: 15 18  Temp: (!) 36.4 C (!) 36.3 C  SpO2: 93% 95%    Last Pain:  Vitals:   11/15/22 1115  TempSrc: Temporal  PainSc: [name]

## 2022-11-15 NOTE — Op Note (Signed)
Umbilical Hernia Repair (anterior abdominal wall hernia with fascial defect less than 3 cm, initial, incarcerated with omentum).  Partial omentectomy.  Pre-operative Diagnosis: Umbilical hernia  Post-operative Diagnosis: same  Surgeon: Ronny Bacon, MD FACS  Anesthesia: General   Findings: 1.5 cm fascial defect diameter    Estimated Blood Loss: 5 mL                 Specimens: Normal appearing preperitoneal fat with sac, portion of omentum, discarded.         Complications: none              Procedure Details  The patient was seen again in the Holding Room. The benefits, complications, treatment options, and expected outcomes were discussed with the patient. The risks of bleeding, infection, recurrence of symptoms, failure to resolve symptoms, bowel injury, mesh placement, mesh infection, any of which could require further surgery were reviewed with the patient. The likelihood of improving the patient's symptoms with return to their baseline status is good.  The patient and/or family concurred with the proposed plan, giving informed consent.  The patient was taken to Operating Room, identified and the procedure verified.  A Time Out was held and the above information confirmed.  Prior to the induction of general anesthesia, antibiotic prophylaxis was administered. VTE prophylaxis was in place. General endotracheal anesthesia was then administered and tolerated well. After the induction, the abdomen was prepped with Chloraprep and draped in the sterile fashion. The patient was positioned in the supine position.  Incision was created with a scalpel over the cephalad aspect of the umbilical hernia defect. Electrocautery was used to dissect through subcutaneous tissue, the hernia sac was carefully opened.  The incarcerated omentum was not to be reduced, due to its sheer volume relative to the defect.  Vascular pedicles were divided, ligated with Ethibond and divided.  Portion of the omentum  was removed to allow reduction of the residual intra-abdominal omentum.  The remainder of the hernia sac and preperitoneal fat was mobilized and excised.  Hemostasis maintained with electrocautery.  There is no evidence of bowel involvement whatsoever. The hernia was measured.  The defect was larger after removal of the preperitoneal fat and hernia sac, to a size of 1.5 cm.  I closed the hernia defect with interrupted 0 Ethibond sutures.   Incision was closed in a 2 layer fashion with 3-0 Vicryl and 4-0 Monocryl. Dermabond was used to coat the skin. Marcaine quarter percent with epinephrine and lidocaine 1% was used to inject all the fascia and incision sites. Patient tolerated procedure well and there were no immediate complications. Needle and laparotomy counts were correct   Ronny Bacon, M.D., [facility]  11/15/2022 ; 10:27 AM

## 2022-11-15 NOTE — Anesthesia Preprocedure Evaluation (Signed)
Anesthesia Evaluation  Patient identified by MRN, date of birth, ID band Patient awake    Reviewed: Allergy & Precautions, NPO status , Patient's Chart, lab work & pertinent test results  Airway Mallampati: IV  TM Distance: >3 FB Neck ROM: full    Dental  (+) Dental Advidsory Given, Chipped   Pulmonary neg shortness of breath, asthma , sleep apnea and Continuous Positive Airway Pressure Ventilation    Pulmonary exam normal        Cardiovascular hypertension, (-) Past MI and (-) CABG negative cardio ROS Normal cardiovascular exam     Neuro/Psych negative neurological ROS  negative psych ROS   GI/Hepatic negative GI ROS, Neg liver ROS,,,  Endo/Other  negative endocrine ROS    Renal/GU      Musculoskeletal   Abdominal   Peds  Hematology negative hematology ROS (+)   Anesthesia Other Findings Past Medical History: No date: ALLERGIC RHINITIS No date: Anemia No date: Asthma No date: Celiac sprue No date: Family history of adverse reaction to anesthesia     Comment:  FATHER HAS HARD TIME WITH HIS STOMACH WAKING UP No date: Hypertension No date: Pneumonia No date: Pre-diabetes No date: Sleep apnea     Comment:  USES CPAP  Past Surgical History: 05/19/2022: COLONOSCOPY WITH PROPOFOL; N/A     Comment:  Procedure: COLONOSCOPY WITH PROPOFOL;  Surgeon: Jonathon Bellows, MD;  Location: [facility] ENDOSCOPY;  Service:               Gastroenterology;  Laterality: N/A; No date: TONSILLECTOMY  BMI    Body Mass Index: 46.18 kg/m      Reproductive/Obstetrics negative OB ROS                             Anesthesia Physical Anesthesia Plan  ASA: 3  Anesthesia Plan: General ETT   Post-op Pain Management:    Induction: Intravenous  PONV Risk Score and Plan: 2 and Ondansetron, Dexamethasone and Midazolam  Airway Management Planned: Oral ETT  Additional Equipment:   Intra-op Plan:    Post-operative Plan: Extubation in OR  Informed Consent: I have reviewed the patients History and Physical, chart, labs and discussed the procedure including the risks, benefits and alternatives for the proposed anesthesia with the patient or authorized representative who has indicated his/her understanding and acceptance.     Dental Advisory Given  Plan Discussed with: Anesthesiologist, CRNA and Surgeon  Anesthesia Plan Comments: (Patient consented for risks of anesthesia including but not limited to:  - adverse reactions to medications - damage to eyes, teeth, lips or other oral mucosa - nerve damage due to positioning  - sore throat or hoarseness - Damage to heart, brain, nerves, lungs, other parts of body or loss of life  Patient voiced understanding.)       Anesthesia Quick Evaluation

## 2022-11-15 NOTE — Interval H&P Note (Signed)
History and Physical Interval Note:  11/15/2022 8:47 AM  Benjamin Tapia  has presented today for surgery, with the diagnosis of umbilical hernia.  The various methods of treatment have been discussed with the patient and family. After consideration of risks, benefits and other options for treatment, the patient has consented to  Procedure(s): HERNIA REPAIR UMBILICAL ADULT, open, possibly with mesh (N/A) as a surgical intervention.  The patient's history has been reviewed, patient examined, no change in status, stable for surgery.  I have reviewed the patient's chart and labs.  Questions were answered to the patient's satisfaction.     Ronny Bacon

## 2022-11-15 NOTE — Transfer of Care (Signed)
Immediate Anesthesia Transfer of Care Note  Patient: Benjamin Tapia  Procedure(s) Performed: HERNIA REPAIR UMBILICAL ADULT, open, possibly with mesh  Patient Location: PACU  Anesthesia Type:General  Level of Consciousness: awake  Airway & Oxygen Therapy: Patient Spontanous Breathing and Patient connected to face mask oxygen  Post-op Assessment: Report given to RN and Post -op Vital signs reviewed and stable  Post vital signs: Reviewed  Last Vitals:  Vitals Value Taken Time  BP 94/43 11/15/22 1038  Temp 36.2 C 11/15/22 1038  Pulse 80 11/15/22 1040  Resp 22 11/15/22 1040  SpO2 100 % 11/15/22 1040  Vitals shown include unvalidated device data.  Last Pain:  Vitals:   11/15/22 1038  TempSrc:   PainSc: 0-No pain         Complications: No notable events documented.

## 2022-11-15 NOTE — Anesthesia Procedure Notes (Signed)
Procedure Name: Intubation Date/Time: 11/15/2022 9:21 AM  Performed by: Otho Perl, CRNAPre-anesthesia Checklist: Patient identified, Patient being monitored, Timeout performed, Emergency Drugs available and Suction available Patient Re-evaluated:Patient Re-evaluated prior to induction Oxygen Delivery Method: Circle system utilized Preoxygenation: Pre-oxygenation with 100% oxygen Induction Type: IV induction Ventilation: Mask ventilation without difficulty Laryngoscope Size: Mac, McGraph and 4 Grade View: Grade II Tube type: Oral Tube size: 7.5 mm Number of attempts: 2 Airway Equipment and Method: Stylet Placement Confirmation: ETT inserted through vocal cords under direct vision, positive ETCO2 and breath sounds checked- equal and bilateral Secured at: 25 cm Tube secured with: Tape Dental Injury: Teeth and Oropharynx as per pre-operative assessment

## 2022-11-16 ENCOUNTER — Encounter: Payer: Self-pay | Admitting: Surgery

## 2022-11-30 ENCOUNTER — Encounter: Payer: Self-pay | Admitting: Surgery

## 2022-11-30 ENCOUNTER — Other Ambulatory Visit: Payer: Self-pay

## 2022-11-30 ENCOUNTER — Ambulatory Visit (INDEPENDENT_AMBULATORY_CARE_PROVIDER_SITE_OTHER): Payer: BC Managed Care – PPO | Admitting: Surgery

## 2022-11-30 VITALS — BP 146/86 | HR 68 | Temp 98.2°F | Ht 73.0 in | Wt 339.0 lb

## 2022-11-30 DIAGNOSIS — Z09 Encounter for follow-up examination after completed treatment for conditions other than malignant neoplasm: Secondary | ICD-10-CM

## 2022-11-30 DIAGNOSIS — K420 Umbilical hernia with obstruction, without gangrene: Secondary | ICD-10-CM | POA: Diagnosis not present

## 2022-11-30 NOTE — Patient Instructions (Signed)

## 2022-11-30 NOTE — Progress Notes (Signed)
[facility] POST-OP OFFICE VISIT  11/30/2022  HPI: Benjamin Tapia is a 57 y.o. male 15 days s/p umbilical hernia repair.  Expected mild discomfort at times, otherwise no complaints.  Vital signs: BP (!) 146/86   Pulse 68   Temp 98.2 F (36.8 C) (Oral)   Ht 6\' 1"  (1.854 m)   Wt (!) 339 lb (153.8 kg)   SpO2 98%   BMI 44.73 kg/m    Physical Exam: Constitutional: Appears well Abdomen: Soft nontender, all well-rounded. Skin: Dermabond peeling off.  Otherwise incision appears clean dry and intact.  Assessment/Plan: This is a 57 y.o. male 15 days s/p open umbilical hernia repair  Patient Active Problem List   Diagnosis Date Noted   Diverticulosis 10/13/2022   Hyperglycemia 10/13/2022   Class 3 severe obesity due to excess calories with serious comorbidity and body mass index (BMI) of 45.0 to 49.9 in adult [id number]   Umbilical hernia with obstruction, without gangrene 10/13/2022   Encounter for screening colonoscopy    Hyperlipidemia 02/09/2022   Prediabetes [id number]   Umbilical hernia without obstruction and without gangrene 02/09/2022   Essential hypertension 10/16/2020   BMI 45.0-49.9, adult 06/13/2018   Accessory skin tags 06/13/2018   Psoriasis 11/04/2015   Hypersomnia with sleep apnea 10/17/2010   Celiac disease 09/10/2009   ANEMIA, IRON DEFICIENCY 07/21/2009   ALLERGIC RHINITIS 07/09/2009   Asthma 07/09/2009    -Reminded did him of weight lifting restrictions.  May follow up as needed   Ronny Bacon M.D., [facility] 11/30/2022, 3:41 PM

## 2023-02-12 ENCOUNTER — Encounter: Payer: BC Managed Care – PPO | Admitting: Family Medicine

## 2023-03-13 ENCOUNTER — Other Ambulatory Visit: Payer: Self-pay | Admitting: Family

## 2023-03-13 DIAGNOSIS — J4540 Moderate persistent asthma, uncomplicated: Secondary | ICD-10-CM

## 2023-03-13 MED ORDER — ALBUTEROL SULFATE HFA 108 (90 BASE) MCG/ACT IN AERS: INHALATION_SPRAY | RESPIRATORY_TRACT | 1 refills | Status: DC

## 2023-04-15 ENCOUNTER — Other Ambulatory Visit: Payer: Self-pay | Admitting: Family

## 2023-04-15 DIAGNOSIS — I10 Essential (primary) hypertension: Secondary | ICD-10-CM

## 2023-05-08 ENCOUNTER — Other Ambulatory Visit: Payer: Self-pay | Admitting: Family

## 2023-05-08 DIAGNOSIS — I10 Essential (primary) hypertension: Secondary | ICD-10-CM

## 2023-05-14 ENCOUNTER — Other Ambulatory Visit: Payer: Self-pay | Admitting: Family

## 2023-05-14 DIAGNOSIS — J4540 Moderate persistent asthma, uncomplicated: Secondary | ICD-10-CM

## 2023-06-18 ENCOUNTER — Telehealth: Payer: Self-pay

## 2023-06-18 NOTE — Telephone Encounter (Signed)
Patient scheduled a transfer of care appointment via MyChart with Dr. Dana Allan.  Please let us know if this is ok.  I have cancelled his appointment pending your responses.

## 2023-07-12 ENCOUNTER — Other Ambulatory Visit: Payer: Self-pay | Admitting: Family

## 2023-07-12 DIAGNOSIS — I10 Essential (primary) hypertension: Secondary | ICD-10-CM

## 2023-08-10 ENCOUNTER — Ambulatory Visit: Payer: BC Managed Care – PPO | Admitting: Family Medicine

## 2023-08-10 ENCOUNTER — Encounter: Payer: Self-pay | Admitting: Family Medicine

## 2023-08-10 VITALS — BP 118/78 | HR 67 | Temp 98.4°F | Resp 18 | Ht 73.0 in | Wt 337.4 lb

## 2023-08-10 DIAGNOSIS — J4520 Mild intermittent asthma, uncomplicated: Secondary | ICD-10-CM

## 2023-08-10 DIAGNOSIS — I10 Essential (primary) hypertension: Secondary | ICD-10-CM | POA: Diagnosis not present

## 2023-08-10 DIAGNOSIS — R7309 Other abnormal glucose: Secondary | ICD-10-CM

## 2023-08-10 DIAGNOSIS — G4733 Obstructive sleep apnea (adult) (pediatric): Secondary | ICD-10-CM

## 2023-08-10 DIAGNOSIS — R7303 Prediabetes: Secondary | ICD-10-CM

## 2023-08-10 DIAGNOSIS — Z23 Encounter for immunization: Secondary | ICD-10-CM

## 2023-08-10 DIAGNOSIS — D509 Iron deficiency anemia, unspecified: Secondary | ICD-10-CM

## 2023-08-10 DIAGNOSIS — E785 Hyperlipidemia, unspecified: Secondary | ICD-10-CM | POA: Diagnosis not present

## 2023-08-10 DIAGNOSIS — Z808 Family history of malignant neoplasm of other organs or systems: Secondary | ICD-10-CM

## 2023-08-10 DIAGNOSIS — K900 Celiac disease: Secondary | ICD-10-CM

## 2023-08-10 LAB — COMPREHENSIVE METABOLIC PANEL
ALT: 30 U/L (ref 0–53)
AST: 22 U/L (ref 0–37)
Albumin: 4.6 g/dL (ref 3.5–5.2)
Alkaline Phosphatase: 52 U/L (ref 39–117)
BUN: 14 mg/dL (ref 6–23)
CO2: 27 meq/L (ref 19–32)
Calcium: 9.6 mg/dL (ref 8.4–10.5)
Chloride: 101 meq/L (ref 96–112)
Creatinine, Ser: 1.04 mg/dL (ref 0.40–1.50)
GFR: 80.69 mL/min (ref >60.00)
Glucose, Bld: 112 mg/dL — ABNORMAL HIGH (ref 70–99)
Potassium: 4.4 meq/L (ref 3.5–5.1)
Sodium: 139 meq/L (ref 135–145)
Total Bilirubin: 0.5 mg/dL (ref 0.2–1.2)
Total Protein: 7.3 g/dL (ref 6.0–8.3)

## 2023-08-10 LAB — CBC WITH DIFFERENTIAL/PLATELET
Basophils Absolute: 0 10*3/uL (ref 0.0–0.1)
Basophils Relative: 0.6 % (ref 0.0–3.0)
Eosinophils Absolute: 0.1 10*3/uL (ref 0.0–0.7)
Eosinophils Relative: 1.1 % (ref 0.0–5.0)
HCT: 40.2 % (ref 39.0–52.0)
Hemoglobin: 13.2 g/dL (ref 13.0–17.0)
Lymphocytes Relative: 11 % — ABNORMAL LOW (ref 12.0–46.0)
Lymphs Abs: 0.8 10*3/uL (ref 0.7–4.0)
MCHC: 32.8 g/dL (ref 30.0–36.0)
MCV: 92.1 fL (ref 78.0–100.0)
Monocytes Absolute: 0.6 10*3/uL (ref 0.1–1.0)
Monocytes Relative: 8.3 % (ref 3.0–12.0)
Neutro Abs: 6 10*3/uL (ref 1.4–7.7)
Neutrophils Relative %: 79 % — ABNORMAL HIGH (ref 43.0–77.0)
Platelets: 319 10*3/uL (ref 150.0–400.0)
RBC: 4.36 Mil/uL (ref 4.22–5.81)
RDW: 12.7 % (ref 11.5–15.5)
WBC: 7.6 10*3/uL (ref 4.0–10.5)

## 2023-08-10 LAB — LIPID PANEL
Cholesterol: 187 mg/dL (ref 0–200)
HDL: 40.3 mg/dL (ref >39.00)
LDL Cholesterol: 119 mg/dL — ABNORMAL HIGH (ref 0–99)
NonHDL: 146.87
Total CHOL/HDL Ratio: 5
Triglycerides: 137 mg/dL (ref 0.0–149.0)
VLDL: 27.4 mg/dL (ref 0.0–40.0)

## 2023-08-10 LAB — IBC + FERRITIN
Ferritin: 21.8 ng/mL — ABNORMAL LOW (ref 22.0–322.0)
Iron: 94 ug/dL (ref 42–165)
Saturation Ratios: 19.3 % — ABNORMAL LOW (ref 20.0–50.0)
TIBC: 487.2 ug/dL — ABNORMAL HIGH (ref 250.0–450.0)
Transferrin: 348 mg/dL (ref 212.0–360.0)

## 2023-08-10 LAB — HEMOGLOBIN A1C: Hgb A1c MFr Bld: 6.2 % (ref 4.6–6.5)

## 2023-08-10 NOTE — Progress Notes (Unsigned)
   SUBJECTIVE:   Chief Complaint  Patient presents with  . Establish Care   HPI ***  PERTINENT PMH / PSH: ***  OBJECTIVE:  BP 118/78   Pulse 67   Temp 98.4 F (36.9 C) (Oral)   Resp 18   Ht 6\' 1"  (1.854 m)   Wt (!) 337 lb 6 oz (153 kg)   SpO2 97%   BMI 44.51 kg/m    Physical Exam     08/10/2023    9:21 AM 10/13/2022   10:43 AM 02/09/2022    8:48 AM 11/23/2020    9:50 AM 06/13/2018    7:35 AM  Depression screen PHQ 2/9  Decreased Interest 0 0 0 0 0  Down, Depressed, Hopeless 0 0 0 0 0  PHQ - 2 Score 0 0 0 0 0  Altered sleeping 0 0 0 0   Tired, decreased energy 0 0 0 0   Change in appetite 0 1 0 1   Feeling bad or failure about yourself  0 0 0 0   Trouble concentrating 0 0 0 0   Moving slowly or fidgety/restless 0 0 0 0   Suicidal thoughts 0 0 0 0   PHQ-9 Score 0 1 0 1   Difficult doing work/chores Not difficult at all Not difficult at all Not difficult at all Not difficult at all       08/10/2023    9:22 AM 10/13/2022   10:43 AM 02/09/2022    8:48 AM 11/23/2020    9:51 AM  GAD 7 : Generalized Anxiety Score  Nervous, Anxious, on Edge 0 0 0 0  Control/stop worrying 0 0 0 0  Worry too much - different things 0 0 0 0  Trouble relaxing 0 0 0 0  Restless 0 0 0 0  Easily annoyed or irritable 0 0 0 0  Afraid - awful might happen 0 0 0 0  Total GAD 7 Score 0 0 0 0  Anxiety Difficulty Not difficult at all Not difficult at all Not difficult at all     ASSESSMENT/PLAN:  Need for influenza vaccination -     Flu vaccine trivalent PF, 6mos and older(Flulaval,Afluria,Fluarix,Fluzone)  Essential hypertension   PDMP reviewed***  No follow-ups on file.  Dana Allan, MD

## 2023-08-10 NOTE — Patient Instructions (Signed)
It was a pleasure meeting you today. Thank you for allowing me to take part in your health care.  Our goals for today as we discussed include:  We will get some labs today.  If they are abnormal or we need to do something about them, I will call you.  If they are normal, I will send you a message on MyChart (if it is active) or a letter in the mail.  If you don't hear from [place] in 2 weeks, please call the office at the number below.     This is a list of the screening recommended for you and due dates:  Health Maintenance  Topic Date Due   Zoster (Shingles) Vaccine (1 of 2) Never done   COVID-19 Vaccine (2 - Janssen risk series) 05/10/2020   Hepatitis C Screening  10/14/2023*   DTaP/Tdap/Td vaccine (3 - Td or Tdap) 06/13/2028   Colon Cancer Screening  05/19/2032   Flu Shot  Completed   HPV Vaccine  Aged Out   HIV Screening  Discontinued  *Topic was postponed. The date shown is not the original due date.     Follow up 6 months   If you have any questions or concerns, please do not hesitate to call the office at [phone].  I look forward to our next visit and until then take care and stay safe.  Regards,   Dana Allan, MD   [facility]

## 2023-08-12 ENCOUNTER — Encounter: Payer: Self-pay | Admitting: Family Medicine

## 2023-08-12 DIAGNOSIS — R7309 Other abnormal glucose: Secondary | ICD-10-CM | POA: Insufficient documentation

## 2023-08-12 DIAGNOSIS — Z23 Encounter for immunization: Secondary | ICD-10-CM | POA: Insufficient documentation

## 2023-08-12 DIAGNOSIS — Z808 Family history of malignant neoplasm of other organs or systems: Secondary | ICD-10-CM | POA: Insufficient documentation

## 2023-08-12 NOTE — Assessment & Plan Note (Signed)
-  Annual dermatology follow-up due to family history of melanoma.

## 2023-08-12 NOTE — Assessment & Plan Note (Signed)
 Compliant with CPAP at night

## 2023-08-12 NOTE — Assessment & Plan Note (Signed)
Diagnosed by GI specialist and noted on EGD.  No current symptoms reported. -Continue gluten-free diet.

## 2023-08-12 NOTE — Assessment & Plan Note (Signed)
Initiated weight loss conversation Would benefit from GLP1  Patient prefers stay with lifestyle choices  Continue to monitor

## 2023-08-12 NOTE — Assessment & Plan Note (Signed)
Well controlled with Symbicort. Albuterol used as needed. Discussed potential switch to Airsupra for rescue therapy when samples become available. -Continue Symbicort. -Consider trial of Airsupra when available.

## 2023-08-12 NOTE — Assessment & Plan Note (Signed)
History of severe anemia requiring transfusion, attributed to celiac disease. Currently taking iron supplements. -Check ferritin level to ensure not over supplementing. -Consider Accrufer in future for better absorption given history of Celiac

## 2023-08-12 NOTE — Assessment & Plan Note (Signed)
Last A1c was 6. Discussed potential future use of Ozempic for weight loss and glucose control if A1c increases. -Check A1c today.

## 2023-08-12 NOTE — Assessment & Plan Note (Addendum)
Well controlled on spironolactone and valsartan. -Continue Aldactone 25 mg daily -Continue Diovan-HCT 320/25 mg daily -Takes low dose ASA for prevention -Check Cmet

## 2023-10-06 ENCOUNTER — Other Ambulatory Visit: Payer: Self-pay | Admitting: Family

## 2023-10-06 DIAGNOSIS — I10 Essential (primary) hypertension: Secondary | ICD-10-CM

## 2023-11-07 ENCOUNTER — Other Ambulatory Visit: Payer: Self-pay | Admitting: Family

## 2023-11-07 DIAGNOSIS — I10 Essential (primary) hypertension: Secondary | ICD-10-CM

## 2024-02-05 ENCOUNTER — Encounter: Payer: Self-pay | Admitting: Family Medicine

## 2024-02-05 ENCOUNTER — Other Ambulatory Visit: Payer: Self-pay | Admitting: Family Medicine

## 2024-02-05 DIAGNOSIS — J4540 Moderate persistent asthma, uncomplicated: Secondary | ICD-10-CM

## 2024-02-05 DIAGNOSIS — I10 Essential (primary) hypertension: Secondary | ICD-10-CM

## 2024-02-05 MED ORDER — SPIRONOLACTONE 25 MG PO TABS
25.0000 mg | ORAL_TABLET | Freq: Every day | ORAL | 3 refills | Status: AC
Start: 2024-02-05 — End: ?

## 2024-02-05 MED ORDER — VALSARTAN-HYDROCHLOROTHIAZIDE 320-25 MG PO TABS
1.0000 | ORAL_TABLET | Freq: Every day | ORAL | 3 refills | Status: AC
Start: 2024-02-05 — End: ?

## 2024-02-05 MED ORDER — BUDESONIDE-FORMOTEROL FUMARATE 160-4.5 MCG/ACT IN AERO: INHALATION_SPRAY | RESPIRATORY_TRACT | 11 refills | Status: AC

## 2024-02-05 MED ORDER — ALBUTEROL SULFATE HFA 108 (90 BASE) MCG/ACT IN AERS: INHALATION_SPRAY | RESPIRATORY_TRACT | 11 refills | Status: AC

## 2024-05-01 ENCOUNTER — Encounter: Payer: Self-pay | Admitting: Nurse Practitioner

## 2024-05-01 ENCOUNTER — Ambulatory Visit: Payer: Self-pay | Admitting: Nurse Practitioner

## 2024-05-01 VITALS — BP 136/86 | HR 93 | Temp 98.9°F | Ht 73.0 in | Wt 339.0 lb

## 2024-05-01 DIAGNOSIS — Z125 Encounter for screening for malignant neoplasm of prostate: Secondary | ICD-10-CM

## 2024-05-01 DIAGNOSIS — I10 Essential (primary) hypertension: Secondary | ICD-10-CM | POA: Diagnosis not present

## 2024-05-01 DIAGNOSIS — E782 Mixed hyperlipidemia: Secondary | ICD-10-CM | POA: Diagnosis not present

## 2024-05-01 DIAGNOSIS — J309 Allergic rhinitis, unspecified: Secondary | ICD-10-CM

## 2024-05-01 DIAGNOSIS — K900 Celiac disease: Secondary | ICD-10-CM | POA: Diagnosis not present

## 2024-05-01 DIAGNOSIS — J4520 Mild intermittent asthma, uncomplicated: Secondary | ICD-10-CM

## 2024-05-01 DIAGNOSIS — Z1329 Encounter for screening for other suspected endocrine disorder: Secondary | ICD-10-CM

## 2024-05-01 DIAGNOSIS — R7303 Prediabetes: Secondary | ICD-10-CM

## 2024-05-01 DIAGNOSIS — D509 Iron deficiency anemia, unspecified: Secondary | ICD-10-CM

## 2024-05-01 NOTE — Progress Notes (Signed)
 Benjamin Glance, NP-C Phone: [phone]  Benjamin Tapia is a 58 y.o. male who presents today for transfer of care.   Discussed the use of AI scribe software for clinical note transcription with the patient, who gave verbal consent to proceed.  History of Present Illness   Benjamin Tapia is a 58 year old male who presents for a transfer of care.  He has a history of prediabetes and acknowledges poor diet and exercise habits. He has made some dietary improvements by avoiding fried foods and incorporating more vegetables and proteins. He attributes his lack of exercise to low energy levels after work. No excessive thirst or urination.  He was diagnosed with celiac disease several years ago after experiencing severe anemia, with a hemoglobin level of 4.5, requiring eight units of blood transfusion. An endoscopy confirmed the diagnosis. He takes two iron supplements daily. Although he has reduced his gluten intake, he still consumes gluten occasionally in foods like sandwiches, hamburgers, and pasta. Initially, gluten consumption caused stomach cramps and loose stools, but these symptoms have subsided.  He has a history of elevated cholesterol, with an LDL level of 119 in December. He is not on medication for cholesterol but is managing it through diet and exercise.  He uses Symbicort  daily for asthma. He rarely uses his rescue inhaler, except when exposed to dogs, to which he is allergic. He also takes Zyrtec and Flonase daily for allergies, noting that spring is the worst season due to pollen.  He is on valsartan , hydrochlorothiazide , and spironolactone  for blood pressure management. He used to check his blood pressure daily but has reduced the frequency. No chest pain, shortness of breath, dizziness, or swelling.  Social History   Tobacco Use  Smoking Status Never  Smokeless Tobacco Never    Current Outpatient Medications on File Prior to Visit  Medication Sig Dispense Refill   acetaminophen   (TYLENOL ) 500 MG tablet Take 1,000 mg by mouth every 6 (six) hours as needed.     albuterol  (VENTOLIN  HFA) 108 (90 Base) MCG/ACT inhaler INHALE 1-2 PUFFS BY MOUTH EVERY 6 HOURS AS NEEDED FOR WHEEZE OR SHORTNESS OF BREATH 18 each 11   aspirin EC 81 MG tablet Take 81 mg by mouth daily. Swallow whole.     budesonide -formoterol  (SYMBICORT ) 160-4.5 MCG/ACT inhaler INHALE 1 PUFF INTO THE LUNGS IN THE MORNING AND AT BEDTIME. RINSE MOUTH AFTER EACH USE 10.2 each 11   calcium carbonate (TUMS EX) 750 MG chewable tablet Chew 2 tablets by mouth as needed for heartburn.     Cetirizine HCl (ZYRTEC ALLERGY PO) 1 tablet daily.     ferrous gluconate (FERGON) 325 MG tablet Take 325 mg by mouth 2 (two) times daily.     fluocinonide -emollient (LIDEX -E) 0.05 % cream APPLY TO AFFECTED AREA TWICE A DAY (Patient taking differently: as needed.) 60 g 0   fluticasone (FLONASE) 50 MCG/ACT nasal spray Place 1 spray into both nostrils daily.     ibuprofen (ADVIL) 200 MG tablet Take 400 mg by mouth every 6 (six) hours as needed.     spironolactone  (ALDACTONE ) 25 MG tablet Take 1 tablet (25 mg total) by mouth daily. 90 tablet 3   valsartan -hydrochlorothiazide  (DIOVAN -HCT) 320-25 MG tablet Take 1 tablet by mouth daily. 90 tablet 3   No current facility-administered medications on file prior to visit.     ROS see history of present illness  Objective  Physical Exam Vitals:   05/01/24 1307  BP: 136/86  Pulse: 93  Temp: 98.9 F (37.2 C)  SpO2: 99%    BP Readings from Last 3 Encounters:  05/01/24 136/86  08/10/23 118/78  11/30/22 (!) 146/86   Wt Readings from Last 3 Encounters:  05/01/24 (!) 339 lb (153.8 kg)  08/10/23 (!) 337 lb 6 oz (153 kg)  11/30/22 (!) 339 lb (153.8 kg)    Physical Exam Constitutional:      General: He is not in acute distress.    Appearance: Normal appearance. He is obese.  HENT:     Head: Normocephalic.  Cardiovascular:     Rate and Rhythm: Normal rate and regular rhythm.      Heart sounds: Normal heart sounds.  Pulmonary:     Effort: Pulmonary effort is normal.     Breath sounds: Normal breath sounds.  Skin:    General: Skin is warm and dry.  Neurological:     General: No focal deficit present.     Mental Status: He is alert.  Psychiatric:        Mood and Affect: Mood normal.        Behavior: Behavior normal.      Assessment/Plan: Please see individual problem list.  Essential hypertension Assessment & Plan: Blood pressure is managed with valsartan , hydrochlorothiazide , and spironolactone . Encourage regular blood pressure monitoring. Continue current antihypertensive medications.  Orders: -     Comprehensive metabolic panel with GFR; Future  Mixed hyperlipidemia Assessment & Plan: LDL cholesterol was 119 in December, above the target of less than 100. Encourage dietary modifications to lower LDL cholesterol. Plan future fasting labs to monitor lipid levels.  Orders: -     Lipid panel; Future  Prediabetes Assessment & Plan: Prediabetes was identified in December labs. He reports some dietary improvement, but overall diet and exercise remain poor. Encourage dietary and exercise improvements. Plan future fasting labs to monitor glucose levels.  Orders: -     Hemoglobin A1c; Future  Celiac disease Assessment & Plan: Followed by GI. Not strictly adhering to a gluten-free diet but has reduced gluten intake. Encourage adherence to a gluten-free diet.    Iron deficiency anemia, unspecified iron deficiency anemia type Assessment & Plan: Hx of severe anemia is attributed to his celiac disease. He is currently on two iron supplements daily. Not strictly adhering to a gluten-free diet but has reduced gluten intake. Continue iron supplementation. Encourage adherence to a gluten-free diet.  Orders: -     CBC with Differential/Platelet; Future -     IBC + Ferritin; Future  Mild intermittent asthma without complication Assessment & Plan: Asthma is  well-controlled with daily Symbicort . Occasional use of rescue inhaler, especially with dog exposure. Continue Symbicort  daily. Use rescue inhaler as needed. Continue Zyrtec and Flonase for allergy management.   Allergic rhinitis, unspecified seasonality, unspecified trigger Assessment & Plan: Managed with daily Zyrtec and Flonase. Symptoms worsen in spring due to pollen and occasionally in fall due to mold. Continue Zyrtec and Flonase daily.    Morbid obesity (HCC)  Screening PSA (prostate specific antigen) -     PSA; Future  Thyroid  disorder screen -     TSH; Future     Return for fasting lab work then in 6 months for follow up.   Benjamin Glance, NP-C [facility]

## 2024-05-09 ENCOUNTER — Other Ambulatory Visit

## 2024-05-09 DIAGNOSIS — E782 Mixed hyperlipidemia: Secondary | ICD-10-CM | POA: Diagnosis not present

## 2024-05-09 DIAGNOSIS — I10 Essential (primary) hypertension: Secondary | ICD-10-CM | POA: Diagnosis not present

## 2024-05-09 DIAGNOSIS — D509 Iron deficiency anemia, unspecified: Secondary | ICD-10-CM

## 2024-05-09 DIAGNOSIS — R7303 Prediabetes: Secondary | ICD-10-CM

## 2024-05-09 DIAGNOSIS — Z125 Encounter for screening for malignant neoplasm of prostate: Secondary | ICD-10-CM

## 2024-05-09 DIAGNOSIS — Z1329 Encounter for screening for other suspected endocrine disorder: Secondary | ICD-10-CM

## 2024-05-09 LAB — COMPREHENSIVE METABOLIC PANEL WITH GFR
ALT: 28 U/L (ref 0–53)
AST: 22 U/L (ref 0–37)
Albumin: 4.4 g/dL (ref 3.5–5.2)
Alkaline Phosphatase: 38 U/L — ABNORMAL LOW (ref 39–117)
BUN: 16 mg/dL (ref 6–23)
CO2: 29 meq/L (ref 19–32)
Calcium: 9.3 mg/dL (ref 8.4–10.5)
Chloride: 101 meq/L (ref 96–112)
Creatinine, Ser: 1.06 mg/dL (ref 0.40–1.50)
GFR: 78.46 mL/min (ref >60.00)
Glucose, Bld: 104 mg/dL — ABNORMAL HIGH (ref 70–99)
Potassium: 3.9 meq/L (ref 3.5–5.1)
Sodium: 137 meq/L (ref 135–145)
Total Bilirubin: 0.5 mg/dL (ref 0.2–1.2)
Total Protein: 6.9 g/dL (ref 6.0–8.3)

## 2024-05-09 LAB — IBC + FERRITIN
Ferritin: 27.5 ng/mL (ref 22.0–322.0)
Iron: 46 ug/dL (ref 42–165)
Saturation Ratios: 10.2 % — ABNORMAL LOW (ref 20.0–50.0)
TIBC: 450.8 ug/dL — ABNORMAL HIGH (ref 250.0–450.0)
Transferrin: 322 mg/dL (ref 212.0–360.0)

## 2024-05-09 LAB — CBC WITH DIFFERENTIAL/PLATELET
Basophils Absolute: 0 K/uL (ref 0.0–0.1)
Basophils Relative: 0.5 % (ref 0.0–3.0)
Eosinophils Absolute: 0.1 K/uL (ref 0.0–0.7)
Eosinophils Relative: 1.2 % (ref 0.0–5.0)
HCT: 38.2 % — ABNORMAL LOW (ref 39.0–52.0)
Hemoglobin: 12.5 g/dL — ABNORMAL LOW (ref 13.0–17.0)
Lymphocytes Relative: 11.2 % — ABNORMAL LOW (ref 12.0–46.0)
Lymphs Abs: 0.9 K/uL (ref 0.7–4.0)
MCHC: 32.7 g/dL (ref 30.0–36.0)
MCV: 90.5 fl (ref 78.0–100.0)
Monocytes Absolute: 0.6 K/uL (ref 0.1–1.0)
Monocytes Relative: 8.3 % (ref 3.0–12.0)
Neutro Abs: 6 K/uL (ref 1.4–7.7)
Neutrophils Relative %: 78.8 % — ABNORMAL HIGH (ref 43.0–77.0)
Platelets: 276 K/uL (ref 150.0–400.0)
RBC: 4.22 Mil/uL (ref 4.22–5.81)
RDW: 13.1 % (ref 11.5–15.5)
WBC: 7.6 K/uL (ref 4.0–10.5)

## 2024-05-09 LAB — PSA: PSA: 0.26 ng/mL (ref 0.10–4.00)

## 2024-05-09 LAB — LIPID PANEL
Cholesterol: 176 mg/dL (ref 0–200)
HDL: 43 mg/dL (ref >39.00)
LDL Cholesterol: 110 mg/dL — ABNORMAL HIGH (ref 0–99)
NonHDL: 132.64
Total CHOL/HDL Ratio: 4
Triglycerides: 115 mg/dL (ref 0.0–149.0)
VLDL: 23 mg/dL (ref 0.0–40.0)

## 2024-05-09 LAB — HEMOGLOBIN A1C: Hgb A1c MFr Bld: 6.9 % — ABNORMAL HIGH (ref 4.6–6.5)

## 2024-05-09 LAB — TSH: TSH: 1.88 u[IU]/mL (ref 0.35–5.50)

## 2024-05-14 ENCOUNTER — Ambulatory Visit: Payer: Self-pay | Admitting: Nurse Practitioner

## 2024-05-15 ENCOUNTER — Encounter: Payer: Self-pay | Admitting: Nurse Practitioner

## 2024-05-15 NOTE — Assessment & Plan Note (Signed)
 Asthma is well-controlled with daily Symbicort . Occasional use of rescue inhaler, especially with dog exposure. Continue Symbicort  daily. Use rescue inhaler as needed. Continue Zyrtec and Flonase for allergy management.

## 2024-05-15 NOTE — Assessment & Plan Note (Addendum)
 Followed by GI. Not strictly adhering to a gluten-free diet but has reduced gluten intake. Encourage adherence to a gluten-free diet.

## 2024-05-15 NOTE — Assessment & Plan Note (Signed)
 LDL cholesterol was 119 in December, above the target of less than 100. Encourage dietary modifications to lower LDL cholesterol. Plan future fasting labs to monitor lipid levels.

## 2024-05-15 NOTE — Assessment & Plan Note (Signed)
 Managed with daily Zyrtec and Flonase. Symptoms worsen in spring due to pollen and occasionally in fall due to mold. Continue Zyrtec and Flonase daily.

## 2024-05-15 NOTE — Assessment & Plan Note (Signed)
 Blood pressure is managed with valsartan , hydrochlorothiazide , and spironolactone . Encourage regular blood pressure monitoring. Continue current antihypertensive medications.

## 2024-05-15 NOTE — Assessment & Plan Note (Signed)
 Hx of severe anemia is attributed to his celiac disease. He is currently on two iron supplements daily. Not strictly adhering to a gluten-free diet but has reduced gluten intake. Continue iron supplementation. Encourage adherence to a gluten-free diet.

## 2024-05-15 NOTE — Assessment & Plan Note (Signed)
 Prediabetes was identified in December labs. He reports some dietary improvement, but overall diet and exercise remain poor. Encourage dietary and exercise improvements. Plan future fasting labs to monitor glucose levels.

## 2024-10-30 ENCOUNTER — Ambulatory Visit: Admitting: Nurse Practitioner
